# Patient Record
Sex: Male | Born: 1975 | Race: White | Hispanic: No | Marital: Single | Smoking: Never smoker
Health system: Southern US, Community
[De-identification: ages and names within clinical notes are randomized; demographics above are authoritative.]

## PROBLEM LIST (undated history)

## (undated) DIAGNOSIS — E119 Type 2 diabetes mellitus without complications: Secondary | ICD-10-CM

## (undated) DIAGNOSIS — Z87442 Personal history of urinary calculi: Secondary | ICD-10-CM

## (undated) DIAGNOSIS — G629 Polyneuropathy, unspecified: Secondary | ICD-10-CM

## (undated) DIAGNOSIS — R0600 Dyspnea, unspecified: Secondary | ICD-10-CM

## (undated) DIAGNOSIS — N200 Calculus of kidney: Secondary | ICD-10-CM

## (undated) HISTORY — PX: MULTIPLE TOOTH EXTRACTIONS: SHX2053

---

## 2008-03-24 ENCOUNTER — Emergency Department (HOSPITAL_COMMUNITY): Admission: EM | Admit: 2008-03-24 | Discharge: 2008-03-24 | Payer: Self-pay | Admitting: Emergency Medicine

## 2009-06-23 ENCOUNTER — Emergency Department (HOSPITAL_COMMUNITY): Admission: EM | Admit: 2009-06-23 | Discharge: 2009-06-23 | Payer: Self-pay | Admitting: Emergency Medicine

## 2009-06-29 ENCOUNTER — Ambulatory Visit (HOSPITAL_COMMUNITY): Admission: RE | Admit: 2009-06-29 | Discharge: 2009-06-29 | Payer: Self-pay | Admitting: Urology

## 2009-08-09 ENCOUNTER — Ambulatory Visit (HOSPITAL_COMMUNITY): Admission: RE | Admit: 2009-08-09 | Discharge: 2009-08-09 | Payer: Self-pay | Admitting: Urology

## 2011-04-03 LAB — COMPREHENSIVE METABOLIC PANEL
ALT: 43 U/L (ref 0–53)
AST: 47 U/L — ABNORMAL HIGH (ref 0–37)
Albumin: 3.7 g/dL (ref 3.5–5.2)
Alkaline Phosphatase: 64 U/L (ref 39–117)
GFR calc Af Amer: >60 mL/min (ref >60)
Potassium: 4.1 mEq/L (ref 3.5–5.1)
Sodium: 137 mEq/L (ref 135–145)
Total Protein: 7.9 g/dL (ref 6.0–8.3)

## 2011-04-03 LAB — URINE MICROSCOPIC-ADD ON

## 2011-04-03 LAB — URINALYSIS, ROUTINE W REFLEX MICROSCOPIC
Glucose, UA: NEGATIVE mg/dL
Ketones, ur: NEGATIVE mg/dL
Protein, ur: NEGATIVE mg/dL
pH: 7 (ref 5.0–8.0)

## 2011-04-03 LAB — DIFFERENTIAL
Basophils Relative: 0 % (ref 0–1)
Eosinophils Absolute: 0 10*3/uL (ref 0.0–0.7)
Lymphs Abs: 0.9 10*3/uL (ref 0.7–4.0)
Monocytes Absolute: 0.4 10*3/uL (ref 0.1–1.0)
Monocytes Relative: 3 % (ref 3–12)

## 2011-04-03 LAB — CBC
Hemoglobin: 15.1 g/dL (ref 13.0–17.0)
Platelets: 239 10*3/uL (ref 150–400)
RDW: 13.2 % (ref 11.5–15.5)

## 2012-02-16 ENCOUNTER — Encounter (HOSPITAL_COMMUNITY): Payer: Self-pay | Admitting: *Deleted

## 2012-02-16 ENCOUNTER — Emergency Department (HOSPITAL_COMMUNITY)
Admission: EM | Admit: 2012-02-16 | Discharge: 2012-02-17 | Disposition: A | Discharge status: Home / Self Care | Payer: Self-pay | Attending: Emergency Medicine | Admitting: Emergency Medicine

## 2012-02-16 DIAGNOSIS — N509 Disorder of male genital organs, unspecified: Secondary | ICD-10-CM | POA: Insufficient documentation

## 2012-02-16 DIAGNOSIS — F17200 Nicotine dependence, unspecified, uncomplicated: Secondary | ICD-10-CM | POA: Insufficient documentation

## 2012-02-16 DIAGNOSIS — N498 Inflammatory disorders of other specified male genital organs: Secondary | ICD-10-CM | POA: Insufficient documentation

## 2012-02-16 DIAGNOSIS — L0291 Cutaneous abscess, unspecified: Secondary | ICD-10-CM

## 2012-02-16 MED ORDER — LIDOCAINE HCL (PF) 1 % IJ SOLN
INTRAMUSCULAR | Status: AC
  Administered 2012-02-17: 01:00:00
  Filled 2012-02-16: qty 5

## 2012-02-16 NOTE — ED Notes (Signed)
John Meeker, PA in with pt to i&d abcess

## 2012-02-16 NOTE — ED Provider Notes (Addendum)
History     CSN: [id number]  Arrival date & time 02/16/12  2239   First MD Initiated Contact with Patient 02/16/12 2310      Chief Complaint  Patient presents with  . Abscess    (Consider location/radiation/quality/duration/timing/severity/associated sxs/prior treatment) HPI John Church is a 36 y.o. male who presents to the Emergency Department complaining of an abscess under his scrotum that he has had since last Friday. He states it started out as a small red, tender, papule and ha gotten larger and more inflamed over the course of a week. He has been using warm compresses with no relief. Today it opened and he had a large amount of blood. He has taken no medicines.  No PCP  History reviewed. No pertinent past medical history.  History reviewed. No pertinent past surgical history.  No family history on file.  History  Substance Use Topics  . Smoking status: Current Everyday Smoker  . Smokeless tobacco: Not on file  . Alcohol Use:       Review of Systems 10 Systems reviewed and are negative for acute change except as noted in the HPI. Allergies  Penicillins  Home Medications  No current outpatient prescriptions on file.  BP 155/94  Pulse 105  Temp(Src) 98.7 F (37.1 C) (Oral)  Resp 18  Ht 6\' 2"  (1.88 m)  Wt 310 lb (140.615 kg)  BMI 39.80 kg/m2  SpO2 99%  Physical Exam Physical examination:  Nursing notes reviewed; Vital signs and O2 SAT reviewed;  Constitutional: Well developed, Well nourished, Well hydrated, In no acute distress; Head:  Normocephalic, atraumatic; Eyes: EOMI, PERRL, No scleral icterus; ENMT: Mouth and pharynx normal, Mucous membranes moist; Neck: Supple, Full range of motion, No lymphadenopathy; Cardiovascular: Regular rate and rhythm, No murmur, rub, or gallop; Respiratory: Breath sounds clear & equal bilaterally, No rales, rhonchi, wheezes, or rub, Normal respiratory effort/excursion; Chest: Nontender, Movement normal; Abdomen: Soft,  Nontender, Nondistended, Normal bowel sounds; Genitourinary: No CVA tenderness;Genital and  performed with pt permission and male ED Tech chaparone present during exam.  Pt examined laying.  No perineal erythema.  No penile lesions or drainage.  Right  scrotal erythema, edema  And tenderness tenderness to palp to a 2 cm area that has a central small area of drainage. Drainage is blood mixed with purulent material..  Normal testicular lie.  No testicular tenderness to palp.   No inguinal LAN or palpable masses.   Extremities: Pulses normal, No tenderness, No edema, No calf edema or asymmetry.; Neuro: AA&Ox3, Major CN grossly intact.  No gross focal motor or sensory deficits in extremities.; Skin: Color normal, Warm, Dry  ED Course  INCISION AND DRAINAGE Date/Time: 02/16/2012 11:25 PM Performed by: Worthy Rancher Authorized by: Worthy Rancher Consent: Verbal consent obtained. Written consent not obtained. Risks and benefits: risks, benefits and alternatives were discussed Consent given by: patient Patient understanding: patient states understanding of the procedure being performed Imaging studies: imaging studies not available Required items: required blood products, implants, devices, and special equipment available Patient identity confirmed: verbally with patient Time out: Immediately prior to procedure a "time out" was called to verify the correct patient, procedure, equipment, support staff and site/side marked as required. Type: abscess Anesthesia: local infiltration Local anesthetic: lidocaine 2% without epinephrine Anesthetic total: 4 ml Patient sedated: no Scalpel size: 11 Needle gauge: 25 ga. Incision type: single straight Complexity: simple Drainage: purulent Drainage amount: copious Wound treatment: drain placed Packing material: 1/4 in iodoform gauze (~  18 inches ) Patient tolerance: Patient tolerated the procedure well with no immediate complications.   (including  critical care time)     MDM  Patient with scrotal wall abscess. I&D performed by midlevel with my supervision. Pt stable in ED with no significant deterioration in condition.The patient appears reasonably screened and/or stabilized for discharge and I doubt any other medical condition or other  [facility] requiring further screening, evaluation, or treatment in the ED at this time prior to discharge.   MDM Reviewed: nursing note and vitals    Medical screening examination/treatment/procedure(s) were conducted as a shared visit with non-physician practitioner(s) and myself.  I personally evaluated the patient during the encounter    Worthy Rancher, [place] 02/17/12 0001  Nicoletta Dress. Colon Branch, MD 02/17/12 0004  Nicoletta Dress. Colon Branch, MD 02/17/12 0005

## 2012-02-16 NOTE — ED Notes (Signed)
Abscess in groin area 

## 2012-02-17 MED ORDER — SULFAMETHOXAZOLE-TRIMETHOPRIM 800-160 MG PO TABS: 1.0000 | ORAL_TABLET | Freq: Two times a day (BID) | ORAL | Status: AC

## 2012-02-17 NOTE — Discharge Instructions (Signed)
Abscess Care After An abscess (also called a boil or furuncle) is an infected area that contains a collection of pus. Signs and symptoms of an abscess include pain, tenderness, redness, or hardness, or you may feel a moveable soft area under your skin. An abscess can occur anywhere in the body. The infection may spread to surrounding tissues causing cellulitis. A cut (incision) by the surgeon was made over your abscess and the pus was drained out. Gauze may have been packed into the space to provide a drain that will allow the cavity to heal from the inside outwards. The boil may be painful for 5 to 7 days. Most people with a boil do not have high fevers. Your abscess, if seen early, may not have localized, and may not have been lanced. If not, another appointment may be required for this if it does not get better on its own or with medications. HOME CARE INSTRUCTIONS   Only take over-the-counter or prescription medicines for pain, discomfort, or fever as directed by your caregiver.   When you bathe, soak and then remove gauze or iodoform packs at least daily or as directed by your caregiver. You may then wash the wound gently with mild soapy water. Repack with gauze or do as your caregiver directs.  SEEK IMMEDIATE MEDICAL CARE IF:   You develop increased pain, swelling, redness, drainage, or bleeding in the wound site.   You develop signs of generalized infection including muscle aches, chills, fever, or a general ill feeling.   An oral temperature above 102 F (38.9 C) develops, not controlled by medication.  See your caregiver for a recheck if you develop any of the symptoms described above. If medications (antibiotics) were prescribed, take them as directed. Document Released: 06/29/2005 Document Revised: 08/23/2011 Document Reviewed: 02/24/2008 Exit[employer] Patient Information 2012 [employer]. 

## 2012-02-19 LAB — WOUND CULTURE

## 2017-02-09 DIAGNOSIS — E119 Type 2 diabetes mellitus without complications: Secondary | ICD-10-CM | POA: Diagnosis not present

## 2017-03-30 DIAGNOSIS — E119 Type 2 diabetes mellitus without complications: Secondary | ICD-10-CM | POA: Diagnosis not present

## 2017-04-05 DIAGNOSIS — M79672 Pain in left foot: Secondary | ICD-10-CM | POA: Diagnosis not present

## 2017-04-05 DIAGNOSIS — M79671 Pain in right foot: Secondary | ICD-10-CM | POA: Diagnosis not present

## 2017-04-05 DIAGNOSIS — E119 Type 2 diabetes mellitus without complications: Secondary | ICD-10-CM | POA: Diagnosis not present

## 2018-01-01 DIAGNOSIS — R739 Hyperglycemia, unspecified: Secondary | ICD-10-CM | POA: Diagnosis not present

## 2018-01-01 DIAGNOSIS — E119 Type 2 diabetes mellitus without complications: Secondary | ICD-10-CM | POA: Diagnosis not present

## 2018-01-16 DIAGNOSIS — M79672 Pain in left foot: Secondary | ICD-10-CM | POA: Diagnosis not present

## 2018-01-16 DIAGNOSIS — M79671 Pain in right foot: Secondary | ICD-10-CM | POA: Diagnosis not present

## 2018-01-16 DIAGNOSIS — E119 Type 2 diabetes mellitus without complications: Secondary | ICD-10-CM | POA: Diagnosis not present

## 2018-05-09 DIAGNOSIS — E119 Type 2 diabetes mellitus without complications: Secondary | ICD-10-CM | POA: Diagnosis not present

## 2018-05-15 DIAGNOSIS — M79671 Pain in right foot: Secondary | ICD-10-CM | POA: Diagnosis not present

## 2018-05-15 DIAGNOSIS — M79672 Pain in left foot: Secondary | ICD-10-CM | POA: Diagnosis not present

## 2018-05-15 DIAGNOSIS — E119 Type 2 diabetes mellitus without complications: Secondary | ICD-10-CM | POA: Diagnosis not present

## 2018-08-07 DIAGNOSIS — E119 Type 2 diabetes mellitus without complications: Secondary | ICD-10-CM | POA: Diagnosis not present

## 2018-09-26 DIAGNOSIS — M79672 Pain in left foot: Secondary | ICD-10-CM | POA: Diagnosis not present

## 2018-09-26 DIAGNOSIS — M79671 Pain in right foot: Secondary | ICD-10-CM | POA: Diagnosis not present

## 2018-09-26 DIAGNOSIS — E119 Type 2 diabetes mellitus without complications: Secondary | ICD-10-CM | POA: Diagnosis not present

## 2018-12-25 DIAGNOSIS — U071 COVID-19: Secondary | ICD-10-CM

## 2018-12-25 HISTORY — DX: COVID-19: U07.1

## 2018-12-30 DIAGNOSIS — E119 Type 2 diabetes mellitus without complications: Secondary | ICD-10-CM | POA: Diagnosis not present

## 2019-01-02 DIAGNOSIS — M79672 Pain in left foot: Secondary | ICD-10-CM | POA: Diagnosis not present

## 2019-01-02 DIAGNOSIS — M79671 Pain in right foot: Secondary | ICD-10-CM | POA: Diagnosis not present

## 2019-01-02 DIAGNOSIS — E119 Type 2 diabetes mellitus without complications: Secondary | ICD-10-CM | POA: Diagnosis not present

## 2019-04-07 DIAGNOSIS — R739 Hyperglycemia, unspecified: Secondary | ICD-10-CM | POA: Diagnosis not present

## 2019-04-07 DIAGNOSIS — E119 Type 2 diabetes mellitus without complications: Secondary | ICD-10-CM | POA: Diagnosis not present

## 2019-04-10 DIAGNOSIS — E119 Type 2 diabetes mellitus without complications: Secondary | ICD-10-CM | POA: Diagnosis not present

## 2019-04-10 DIAGNOSIS — M79672 Pain in left foot: Secondary | ICD-10-CM | POA: Diagnosis not present

## 2019-04-10 DIAGNOSIS — M79671 Pain in right foot: Secondary | ICD-10-CM | POA: Diagnosis not present

## 2019-12-01 ENCOUNTER — Encounter (HOSPITAL_COMMUNITY): Payer: Self-pay

## 2019-12-01 ENCOUNTER — Emergency Department (HOSPITAL_COMMUNITY)
Admission: EM | Admit: 2019-12-01 | Discharge: 2019-12-01 | Disposition: A | Discharge status: Home / Self Care | Payer: 59 | Attending: Emergency Medicine | Admitting: Emergency Medicine

## 2019-12-01 ENCOUNTER — Other Ambulatory Visit: Payer: Self-pay

## 2019-12-01 DIAGNOSIS — Z87891 Personal history of nicotine dependence: Secondary | ICD-10-CM | POA: Diagnosis not present

## 2019-12-01 DIAGNOSIS — Z79899 Other long term (current) drug therapy: Secondary | ICD-10-CM | POA: Insufficient documentation

## 2019-12-01 DIAGNOSIS — R112 Nausea with vomiting, unspecified: Secondary | ICD-10-CM | POA: Insufficient documentation

## 2019-12-01 DIAGNOSIS — E119 Type 2 diabetes mellitus without complications: Secondary | ICD-10-CM | POA: Diagnosis not present

## 2019-12-01 DIAGNOSIS — Z7984 Long term (current) use of oral hypoglycemic drugs: Secondary | ICD-10-CM | POA: Diagnosis not present

## 2019-12-01 HISTORY — DX: Type 2 diabetes mellitus without complications: E11.9

## 2019-12-01 HISTORY — DX: Calculus of kidney: N20.0

## 2019-12-01 LAB — BLOOD GAS, VENOUS
Acid-Base Excess: 0.4 mmol/L (ref 0.0–2.0)
Bicarbonate: 23.8 mmol/L (ref 20.0–28.0)
Drawn by: 4437
FIO2: 21
O2 Saturation: 59 %
Patient temperature: 37.3
pCO2, Ven: 40.2 mmHg — ABNORMAL LOW (ref 44.0–60.0)
pH, Ven: 7.402 (ref 7.250–7.430)
pO2, Ven: 31.8 mmHg — CL (ref 32.0–45.0)

## 2019-12-01 LAB — COMPREHENSIVE METABOLIC PANEL
ALT: 49 U/L — ABNORMAL HIGH (ref 0–44)
AST: 38 U/L (ref 15–41)
Albumin: 3.8 g/dL (ref 3.5–5.0)
Alkaline Phosphatase: 66 U/L (ref 38–126)
Anion gap: 13 (ref 5–15)
BUN: 10 mg/dL (ref 6–20)
CO2: 21 mmol/L — ABNORMAL LOW (ref 22–32)
Calcium: 8.2 mg/dL — ABNORMAL LOW (ref 8.9–10.3)
Chloride: 96 mmol/L — ABNORMAL LOW (ref 98–111)
Creatinine, Ser: 0.87 mg/dL (ref 0.61–1.24)
GFR calc Af Amer: >60 mL/min (ref >60)
GFR calc non Af Amer: >60 mL/min (ref >60)
Glucose, Bld: 358 mg/dL — ABNORMAL HIGH (ref 70–99)
Potassium: 3.3 mmol/L — ABNORMAL LOW (ref 3.5–5.1)
Sodium: 130 mmol/L — ABNORMAL LOW (ref 135–145)
Total Bilirubin: 1.6 mg/dL — ABNORMAL HIGH (ref 0.3–1.2)
Total Protein: 8.4 g/dL — ABNORMAL HIGH (ref 6.5–8.1)

## 2019-12-01 LAB — URINALYSIS, ROUTINE W REFLEX MICROSCOPIC
Bilirubin Urine: NEGATIVE
Glucose, UA: >=500 mg/dL — AB
Ketones, ur: 80 mg/dL — AB
Leukocytes,Ua: NEGATIVE
Nitrite: NEGATIVE
Protein, ur: 30 mg/dL — AB
Specific Gravity, Urine: 1.032 — ABNORMAL HIGH (ref 1.005–1.030)
pH: 6 (ref 5.0–8.0)

## 2019-12-01 LAB — CBC
HCT: 50.2 % (ref 39.0–52.0)
Hemoglobin: 17.2 g/dL — ABNORMAL HIGH (ref 13.0–17.0)
MCH: 28.7 pg (ref 26.0–34.0)
MCHC: 34.3 g/dL (ref 30.0–36.0)
MCV: 83.7 fL (ref 80.0–100.0)
Platelets: 130 10*3/uL — ABNORMAL LOW (ref 150–400)
RBC: 6 MIL/uL — ABNORMAL HIGH (ref 4.22–5.81)
RDW: 11.9 % (ref 11.5–15.5)
WBC: 4.6 10*3/uL (ref 4.0–10.5)
nRBC: 0 % (ref 0.0–0.2)

## 2019-12-01 LAB — BETA-HYDROXYBUTYRIC ACID: Beta-Hydroxybutyric Acid: 1.85 mmol/L — ABNORMAL HIGH (ref 0.05–0.27)

## 2019-12-01 LAB — LIPASE, BLOOD: Lipase: 18 U/L (ref 11–51)

## 2019-12-01 MED ORDER — ONDANSETRON 4 MG PO TBDP: 4.0000 mg | ORAL_TABLET | Freq: Three times a day (TID) | ORAL | 0 refills | Status: DC | PRN

## 2019-12-01 MED ORDER — ONDANSETRON 4 MG PO TBDP
4.0000 mg | ORAL_TABLET | Freq: Once | ORAL | Status: AC | PRN
  Administered 2019-12-01: 4 mg via ORAL
  Filled 2019-12-01: qty 1

## 2019-12-01 MED ORDER — SODIUM CHLORIDE 0.9 % IV BOLUS
1000.0000 mL | Freq: Once | INTRAVENOUS | Status: AC
  Administered 2019-12-01: 1000 mL via INTRAVENOUS

## 2019-12-01 MED ORDER — POTASSIUM CHLORIDE CRYS ER 20 MEQ PO TBCR
40.0000 meq | EXTENDED_RELEASE_TABLET | Freq: Once | ORAL | Status: AC
Start: 2019-12-01 — End: 2019-12-01
  Administered 2019-12-01: 40 meq via ORAL
  Filled 2019-12-01: qty 2

## 2019-12-01 NOTE — ED Notes (Addendum)
Per pt, emesis x 9 times today.  Pt tested positive for covid on Friday.

## 2019-12-01 NOTE — Discharge Instructions (Addendum)
Please take Zofran ODT for her nausea symptoms, as prescribed.  Please also continue to hydrate to replace lost fluids.  Please continue to take Metformin at your scheduled times and to continue regularly checking your blood sugars.  Please return to the ED or seek medical attention should you develop any new or worsening symptoms.

## 2019-12-01 NOTE — ED Triage Notes (Signed)
Pt diagnoses with Covid on Friday. Pt states he started vomiting this am and unable to keep anything down.

## 2019-12-01 NOTE — ED Notes (Signed)
Date and time results received: 12/01/19 9:40 PM  (use smartphrase ".now" to insert current time)  Test: PO2 31.8   Name of Provider Notified: Nyoka Cowden & Zammit  Orders Received? Or Actions Taken?:  None at this time.

## 2019-12-01 NOTE — ED Provider Notes (Signed)
[facility] EMERGENCY DEPARTMENT Provider Note   CSN: [id number] Arrival date & time: 12/01/19  1620     History   Chief Complaint Chief Complaint  Patient presents with  . Emesis  . Covid Positive    HPI John Church is a 43 y.o. male with PMH significant for type II DM and positive COVID-19 testing obtained 11/28/2019 presents to the ED with 1 day history of uncontrolled nausea and vomiting.  Patient reports he was for symptomatic of COVID-19 on 11/24/2019 and was tested 5 days later after he lost his sense of taste and smell.  He reportedly was doing fine, but this morning he woke up with profound nausea and vomiting.  He suspects that it may have been related to postnasal drip from his runny nose, but his inability to tolerate food or liquid by mouth persisted all day long.  He denies any hematemesis.  He also denies any headache or dizziness, fevers or chills, chest pain or shortness of breath, difficult breathing, or significant abdominal tenderness.  Patient reports loose stools, but he is on Metformin for his diabetes and it is no more than usual.    HPI  Past Medical History:  Diagnosis Date  . Diabetes mellitus without complication ([place])   . Kidney stone     There are no active problems to display for this patient.   History reviewed. No pertinent surgical history.      Home Medications    Prior to Admission medications   Medication Sig Start Date End Date Taking? Authorizing Provider  metFORMIN (GLUCOPHAGE) 500 MG tablet Take 500 mg by mouth 2 (two) times daily. 11/17/19  Yes [provider]  ONGLYZA 5 MG TABS tablet Take 5 mg by mouth daily. 11/17/19  Yes [provider]  ondansetron (ZOFRAN ODT) 4 MG disintegrating tablet Take 1 tablet (4 mg total) by mouth every 8 (eight) hours as needed for nausea or vomiting. 12/01/19   Corena Herter, PA-C    Family History No family history on file.  Social History Social History   Tobacco Use   . Smoking status: Former Research scientist (life sciences)  . Smokeless tobacco: Never Used  Substance Use Topics  . Alcohol use: Not Currently  . Drug use: Not Currently     Allergies   Penicillins   Review of Systems Review of Systems  All other systems reviewed and are negative.    Physical Exam Updated Vital Signs BP 126/73   Pulse 98   Temp 99.2 F (37.3 C) (Oral)   Resp 20   Ht 6\' 2"  (1.88 m)   Wt 117.9 kg   SpO2 92%   BMI 33.38 kg/m   Physical Exam Vitals signs and nursing note reviewed. Exam conducted with a chaperone present.  Constitutional:      Appearance: Normal appearance. He is not diaphoretic.  HENT:     Head: Normocephalic and atraumatic.  Eyes:     General: No scleral icterus.    Conjunctiva/sclera: Conjunctivae normal.  Cardiovascular:     Rate and Rhythm: Normal rate and regular rhythm.     Pulses: Normal pulses.     Heart sounds: Normal heart sounds.  Pulmonary:     Effort: Pulmonary effort is normal. No respiratory distress.     Breath sounds: Normal breath sounds.  Abdominal:     Comments: Soft, nondistended.  No significant TTP.  No overlying skin changes.  No guarding.  Skin:    General: Skin is dry.  Neurological:  Mental Status: He is alert and oriented to person, place, and time.     GCS: GCS eye subscore is 4. GCS verbal subscore is 5. GCS motor subscore is 6.  Psychiatric:        Mood and Affect: Mood normal.        Behavior: Behavior normal.        Thought Content: Thought content normal.      ED Treatments / Results  Labs (all labs ordered are listed, but only abnormal results are displayed) Labs Reviewed  COMPREHENSIVE METABOLIC PANEL - Abnormal; Notable for the following components:      Result Value   Sodium 130 (*)    Potassium 3.3 (*)    Chloride 96 (*)    CO2 21 (*)    Glucose, Bld 358 (*)    Calcium 8.2 (*)    Total Protein 8.4 (*)    ALT 49 (*)    Total Bilirubin 1.6 (*)    All other components within normal limits  CBC -  Abnormal; Notable for the following components:   RBC 6.00 (*)    Hemoglobin 17.2 (*)    Platelets 130 (*)    All other components within normal limits  BLOOD GAS, VENOUS - Abnormal; Notable for the following components:   pCO2, Ven 40.2 (*)    pO2, Ven 31.8 (*)    All other components within normal limits  BETA-HYDROXYBUTYRIC ACID - Abnormal; Notable for the following components:   Beta-Hydroxybutyric Acid 1.85 (*)    All other components within normal limits  LIPASE, BLOOD  URINALYSIS, ROUTINE W REFLEX MICROSCOPIC    EKG None  Radiology No results found.  Procedures Procedures (including critical care time)  Medications Ordered in ED Medications  ondansetron (ZOFRAN-ODT) disintegrating tablet 4 mg (4 mg Oral Given 12/01/19 1720)  sodium chloride 0.9 % bolus 1,000 mL (0 mLs Intravenous Stopped 12/01/19 2245)  potassium chloride SA (KLOR-CON) CR tablet 40 mEq (40 mEq Oral Given 12/01/19 2244)     Initial Impression / Assessment and Plan / ED Course  I have reviewed the triage vital signs and the nursing notes.  Pertinent labs & imaging results that were available during my care of the patient were reviewed by me and considered in my medical decision making (see chart for details).        Patient's venous pH is 7.40 and he is not acidotic, despite elevated beta hydroxybutyric acid.  Repleted low potassium with 40 mEq K-Dur.  Repleted his hyponatremia with 1 L normal saline.  Patient is feeling significantly improved after receiving Zofran ODT in triage.  No episodes of nausea or vomiting while in the ED.  CBC demonstrates no significant abnormalities.  UA demonstrates glucosuria and ketonuria, as expected, as well as a specific gravity that is elevated 1.032.  Will encourage increased oral hydration.  On reevaluation, patient is significantly improved rehydrated.  He feels ready for discharge.  Encouraged him to track his blood sugars regularly and to take Metformin, as  prescribed.  Discussed case with Dr. Estell Harpin.  We will prescribe him Zofran ODT to take as needed for nausea and vomiting.  Encouraged increased fluid hydration given his dehydration with elevated heart rate.   All of the evaluation and work-up results were discussed with the patient and any family at bedside. They were provided opportunity to ask any additional questions and have none at this time. They have expressed understanding of verbal discharge instructions as well as return precautions  and are agreeable to the plan.   [facility] 12[facility]2019 for the symptoms described in the history of present illness. He was evaluated in the context of the global COVID-19 pandemic, which necessitated consideration that the patient might be at risk for infection with the SARS-CoV-2 virus that causes COVID-19. Institutional protocols and algorithms that pertain to the evaluation of patients at risk for COVID-19 are in a state of rapid change based on information released by regulatory bodies including the CDC and federal and state organizations. These policies and algorithms were followed during the patient's care in the ED.   Final Clinical Impressions(s) / ED Diagnoses   Final diagnoses:  Non-intractable vomiting with nausea, unspecified vomiting type    ED Discharge Orders         Ordered    ondansetron (ZOFRAN ODT) 4 MG disintegrating tablet  Every 8 hours PRN     12/01/19 2256           Lorelee NewGreen, [place] Zapien L, PA-C 12/01/19 2309    Bethann BerkshireZammit, Joseph, MD 12/05/19 [phone]

## 2021-04-24 ENCOUNTER — Ambulatory Visit
Admission: EM | Admit: 2021-04-24 | Discharge: 2021-04-24 | Disposition: A | Discharge status: Home / Self Care | Payer: 59

## 2021-04-24 ENCOUNTER — Other Ambulatory Visit: Payer: Self-pay

## 2021-04-24 ENCOUNTER — Encounter: Payer: Self-pay | Admitting: *Deleted

## 2021-04-24 DIAGNOSIS — L089 Local infection of the skin and subcutaneous tissue, unspecified: Secondary | ICD-10-CM

## 2021-04-24 DIAGNOSIS — E11628 Type 2 diabetes mellitus with other skin complications: Secondary | ICD-10-CM | POA: Diagnosis not present

## 2021-04-24 DIAGNOSIS — E08621 Diabetes mellitus due to underlying condition with foot ulcer: Secondary | ICD-10-CM

## 2021-04-24 HISTORY — DX: Polyneuropathy, unspecified: G62.9

## 2021-04-24 LAB — POCT FASTING CBG KUC MANUAL ENTRY: POCT Glucose (KUC): 107 mg/dL — AB (ref 70–99)

## 2021-04-24 MED ORDER — DOXYCYCLINE HYCLATE 100 MG PO CAPS: 100.0000 mg | ORAL_CAPSULE | Freq: Two times a day (BID) | ORAL | 0 refills | Status: DC

## 2021-04-24 NOTE — ED Provider Notes (Addendum)
[facility]    CSN: [id number] Arrival date & time: 04/24/21  1202      History   Chief Complaint Chief Complaint  Patient presents with  . Toe Pain    HPI John Church is a 45 y.o. male.   HPI  Patient presents today for evaluation of a left toe infection.  Patient is a type II diabetic and reports that his last A1c was approximately 9.  He only recently began to take [place] and O[place]lyza.  He has severe peripheral neuropathy, and is uncertain how long the great toe infection has been present.  The toe became painful last night while at work and began to drain pus drainage. He doesn't check his blood sugar at home.  Past Medical History:  Diagnosis Date  . Diabetes mellitus without complication (HCC)   . Kidney stone   . Kidney stone   . Peripheral neuropathy     There are no problems to display for this patient.   History reviewed. No pertinent surgical history.     Home Medications    Prior to Admission medications   Medication Sig Start Date End Date Taking? Authorizing Provider  empagliflozin (JARDIANCE) 25 MG TABS tablet Take by mouth daily. Started 04/21/2021   Yes [provider]  metFORMIN (GLUCOPHAGE) 500 MG tablet Take 500 mg by mouth 2 (two) times daily. 11/17/19  Yes [provider]  ONGLYZA 5 MG TABS tablet Take 5 mg by mouth daily. 11/17/19  Yes [provider]  ondansetron (ZOFRAN ODT) 4 MG disintegrating tablet Take 1 tablet (4 mg total) by mouth every 8 (eight) hours as needed for nausea or vomiting. 12/01/19   Lorelee New, PA-C    Family History Family History  Problem Relation Age of Onset  . Diabetes Mother   . Diabetes Father   . Diabetes Brother     Social History Social History   Tobacco Use  . Smoking status: Never Smoker  . Smokeless tobacco: Never Used  Vaping Use  . Vaping Use: Never used  Substance Use Topics  . Alcohol use: Not Currently  . Drug use: Never     Allergies    Penicillins   Review of Systems Review of Systems Pertinent negatives listed in HPI   Physical Exam Triage Vital Signs ED Triage Vitals  Enc Vitals Group     BP 04/24/21 1423 137/88     Pulse Rate 04/24/21 1423 (!) 107     Resp 04/24/21 1423 18     Temp 04/24/21 1423 98.3 F (36.8 C)     Temp Source 04/24/21 1423 Temporal     SpO2 04/24/21 1423 96 %     Weight --      Height --      Head Circumference --      Peak Flow --      Pain Score 04/24/21 1424 0     Pain Loc --      Pain Edu? --      Excl. in GC? --    No data found.  Updated Vital Signs BP 137/88   Pulse (!) 107   Temp 98.3 F (36.8 C) (Temporal)   Resp 18   SpO2 96%   Visual Acuity Right Eye Distance:   Left Eye Distance:   Bilateral Distance:    Right Eye Near:   Left Eye Near:    Bilateral Near:     Physical Exam General appearance: Alert, non-toxic appearing, no  acute distress Head: Normocephalic, without obvious abnormality, atraumatic Respiratory: Respirations even and unlabored, normal respiratory rate Heart: Tachycardia present, rhythm normal. No gallop or murmurs noted on exam  Extremities: Left foot and ankle edema present, +2 DP pulse, tenderness along base of left toes 1-4. See picture left great toe Skin: Skin color, texture, turgor normal. No rashes seen  Psych: Appropriate mood and affect. Neurologic: GCS 15, normal coordination, normal gait       UC Treatments / Results  Labs (all labs ordered are listed, but only abnormal results are displayed) Labs Reviewed - No data to display  EKG   Radiology No results found.  Procedures Procedures (including critical care time)  Medications Ordered in UC Medications - No data to display  Initial Impression / Assessment and Plan / UC Course  I have reviewed the triage vital signs and the nursing notes.  Pertinent labs & imaging results that were available during my care of the patient were reviewed by me and considered  in my medical decision making (see chart for details).     Diabetic left great toe infection, patient is uncontrolled diabetic.  Concern for possible ischemic changes related to appearance of the tendon.  Will start on antibiotics, Doxycyline  and placed an Urgent Care referral for patient to be evaluated by Dr. Lajoyce Corners at Orthopedics. ER precautions given if pain worsens.  Patient placed in a postop shoe and advised not to wear any closed toed shoe until evaluated by Dr. Lajoyce Corners.  Advised if Dr. Lajoyce Corners is unable to see him within 24 hours to go immediately to the ER. Final Clinical Impressions(s) / UC Diagnoses   Final diagnoses:  Diabetic foot infection (HCC)  Diabetic ulcer of toe of left foot associated with diabetes mellitus due to underlying condition, unspecified ulcer stage [facility])     Discharge Instructions     Complete entire course of medication.  Contact Dr. Lajoyce Corners office in the morning to schedule visit for evaluation of diabetic toe wound infection. If he is unable to see your within 24 hours or your pain worsens, go to emergency department.    ED Prescriptions    Medication Sig Dispense Auth. Provider   doxycycline (VIBRAMYCIN) 100 MG capsule Take 1 capsule (100 mg total) by mouth 2 (two) times daily. 20 capsule Bing Neighbors, FNP     PDMP not reviewed this encounter.   Bing Neighbors, FNP 04/25/21 0802    Bing Neighbors, FNP 04/25/21 [phone]

## 2021-04-24 NOTE — ED Triage Notes (Signed)
C/O redness to left great toe redness and slight discomfort over past couple weeks; wears steel-toed boots at work.  States last night took boot off at work after having pain extending up into left lower leg and significant purulent drainage in sock. Left great toe very red, tight with redness extending up into distal foot; toenail fell off.  Denies fevers.

## 2021-04-24 NOTE — Discharge Instructions (Addendum)
Complete entire course of medication.  Contact Dr. Lajoyce Corners office in the morning to schedule visit for evaluation of diabetic toe wound infection. If he is unable to see your within 24 hours or your pain worsens, go to emergency department.

## 2021-04-25 ENCOUNTER — Encounter: Payer: Self-pay | Admitting: Orthopedic Surgery

## 2021-04-25 ENCOUNTER — Ambulatory Visit: Payer: 59 | Admitting: Orthopedic Surgery

## 2021-04-25 ENCOUNTER — Ambulatory Visit: Payer: Self-pay

## 2021-04-25 DIAGNOSIS — M79672 Pain in left foot: Secondary | ICD-10-CM | POA: Diagnosis not present

## 2021-04-25 LAB — CBC WITH DIFFERENTIAL/PLATELET
Basophils Absolute: 0.1 10*3/uL (ref 0.0–0.2)
Basos: 1 %
EOS (ABSOLUTE): 0.1 10*3/uL (ref 0.0–0.4)
Eos: 1 %
Hematocrit: 47.3 % (ref 37.5–51.0)
Hemoglobin: 15.9 g/dL (ref 13.0–17.7)
Immature Grans (Abs): 0 10*3/uL (ref 0.0–0.1)
Immature Granulocytes: 0 %
Lymphocytes Absolute: 2.1 10*3/uL (ref 0.7–3.1)
Lymphs: 11 %
MCH: 29 pg (ref 26.6–33.0)
MCHC: 33.6 g/dL (ref 31.5–35.7)
MCV: 86 fL (ref 79–97)
Monocytes Absolute: 1.4 10*3/uL — ABNORMAL HIGH (ref 0.1–0.9)
Monocytes: 7 %
Neutrophils Absolute: 15.6 10*3/uL — ABNORMAL HIGH (ref 1.4–7.0)
Neutrophils: 80 %
Platelets: 358 10*3/uL (ref 150–450)
RBC: 5.48 x10E6/uL (ref 4.14–5.80)
RDW: 12.5 % (ref 11.6–15.4)
WBC: 19.3 10*3/uL — ABNORMAL HIGH (ref 3.4–10.8)

## 2021-04-25 NOTE — Addendum Note (Signed)
Addended by: Rodena Medin on: 04/25/2021 02:24 PM   Modules accepted: Orders

## 2021-04-25 NOTE — Progress Notes (Signed)
Office Visit Note   Patient: John Church           Date of Birth: [birth year]           MRN: [id number] Visit Date: 04/25/2021              Requested by: [facility], [facility] [address] [place],  [place] [address] PCP: [facility]  Chief Complaint  Patient presents with  . Left Foot - Follow-up    ER yesterday GT pain      HPI: Patient is a 45 year old gentleman who was seen for initial evaluation for a paronychial infection left great toe patient states that while wearing steel toe shoes at work the abscess ruptured and he had pus in his shoe.  Patient was seen in the emergency room yesterday was started on doxycycline.  Patient states he is a non-smoker his last hemoglobin A1c was 9 his white blood cell count was 19.3.  Patient states that his brother just had his toe amputated for infection as well.  Assessment & Plan: Visit Diagnoses:  1. Pain in left foot     Plan: Patient will stay out of work he is given a note to be out of work for 2 weeks continue the doxycycline start Dial soap cleansing tomorrow we obtained cultures today will adjust antibiotics if needed on Thursday.  Follow-Up Instructions: Return in about 1 week (around 05/02/2021) for Follow-up Thursday.   Ortho Exam  Patient is alert, oriented, no adenopathy, well-dressed, normal affect, normal respiratory effort. Examination patient has a good dorsalis pedis pulse there is no venous stasis ulcers or swelling in his calf there is a paronychial infection and previous avulsion of the great toenail.  There is some bruising on the end of his toe most likely from blunt trauma in his steel toe shoes.  After informed consent patient underwent a digital block with 10 cc of half percent Marcaine plain after adequate levels anesthesia were obtained a freer was used to open up the paronychial infection.  This fluid was cultured and sent for cultures there is no cellulitis proximal to the IP joint.  The drainage  is bloody and not purulent.  Imaging: XR Foot Complete Left  Result Date: 04/25/2021 Three-view radiographs of the left foot shows no soft tissue no destructive bony changes noted calcified vessels  No images are attached to the encounter.  Labs: Lab Results  Component Value Date   REPTSTATUS 02/19/2012 FINAL 02/17/2012   GRAMSTAIN  02/17/2012    FEW WBC PRESENT, PREDOMINANTLY PMN NO SQUAMOUS EPITHELIAL CELLS SEEN MODERATE GRAM POSITIVE COCCI IN PAIRS FEW GRAM NEGATIVE RODS   CULT  02/17/2012    ABUNDANT GROUP B STREP(S.AGALACTIAE)ISOLATED Note: TESTING AGAINST S. AGALACTIAE NOT ROUTINELY PERFORMED DUE TO PREDICTABILITY OF AMP/PEN/VAN SUSCEPTIBILITY.     Lab Results  Component Value Date   ALBUMIN 3.8 12/01/2019   ALBUMIN 3.7 06/23/2009    No results found for: MG No results found for: VD25OH  No results found for: PREALBUMIN CBC EXTENDED Latest Ref Rng & Units 04/24/2021 12/01/2019 06/23/2009  WBC 3.4 - 10.8 x10E3/uL 19.3(H) 4.6 13.4(H)  RBC 4.14 - 5.80 x10E6/uL 5.48 6.00(H) 4.97  HGB 13.0 - 17.7 g/dL 03.5 17.2(H) 15.1  HCT 37.5 - 51.0 % 47.3 50.2 42.8  PLT 150 - 450 x10E3/uL 358 130(L) 239  NEUTROABS 1.4 - 7.0 x10E3/uL 15.6(H) - 12.1(H)  LYMPHSABS 0.7 - 3.1 x10E3/uL 2.1 - 0.9     There is no  height or weight on file to calculate BMI.  Orders:  Orders Placed This Encounter  Procedures  . XR Foot Complete Left   No orders of the defined types were placed in this encounter.    Procedures: No procedures performed  Clinical Data: No additional findings.  ROS:  All other systems negative, except as noted in the HPI. Review of Systems  Objective: Vital Signs: There were no vitals taken for this visit.  Specialty Comments:  No specialty comments available.  PMFS History: There are no problems to display for this patient.  Past Medical History:  Diagnosis Date  . Diabetes mellitus without complication (HCC)   . Kidney stone   . Kidney stone   .  Peripheral neuropathy     Family History  Problem Relation Age of Onset  . Diabetes Mother   . Diabetes Father   . Diabetes Brother     History reviewed. No pertinent surgical history. Social History   Occupational History  . Not on file  Tobacco Use  . Smoking status: Never Smoker  . Smokeless tobacco: Never Used  Vaping Use  . Vaping Use: Never used  Substance and Sexual Activity  . Alcohol use: Not Currently  . Drug use: Never  . Sexual activity: Not on file

## 2021-04-28 ENCOUNTER — Encounter: Payer: Self-pay | Admitting: Orthopedic Surgery

## 2021-04-28 ENCOUNTER — Encounter (HOSPITAL_COMMUNITY): Payer: Self-pay | Admitting: Orthopedic Surgery

## 2021-04-28 ENCOUNTER — Other Ambulatory Visit: Payer: Self-pay

## 2021-04-28 ENCOUNTER — Ambulatory Visit: Payer: 59 | Admitting: Orthopedic Surgery

## 2021-04-28 ENCOUNTER — Other Ambulatory Visit: Payer: Self-pay | Admitting: Physician Assistant

## 2021-04-28 ENCOUNTER — Other Ambulatory Visit (HOSPITAL_COMMUNITY)
Admission: RE | Admit: 2021-04-28 | Discharge: 2021-04-28 | Disposition: A | Discharge status: Home / Self Care | Payer: 59 | Source: Ambulatory Visit | Attending: Orthopedic Surgery | Admitting: Orthopedic Surgery

## 2021-04-28 DIAGNOSIS — Z01812 Encounter for preprocedural laboratory examination: Secondary | ICD-10-CM | POA: Diagnosis present

## 2021-04-28 DIAGNOSIS — L02612 Cutaneous abscess of left foot: Secondary | ICD-10-CM | POA: Diagnosis not present

## 2021-04-28 DIAGNOSIS — Z20822 Contact with and (suspected) exposure to covid-19: Secondary | ICD-10-CM | POA: Diagnosis not present

## 2021-04-28 DIAGNOSIS — M869 Osteomyelitis, unspecified: Secondary | ICD-10-CM | POA: Diagnosis not present

## 2021-04-28 LAB — WOUND CULTURE
MICRO NUMBER:: 11837794
SPECIMEN QUALITY:: ADEQUATE

## 2021-04-28 NOTE — Progress Notes (Signed)
Office Visit Note   Patient: John Church           Date of Birth: 07-Jun-1976           MRN: [id number] Visit Date: 04/28/2021              Requested by: [facility] [address] [place],  [place] [address] PCP: [facility]  Chief Complaint  Patient presents with  . Left Foot - Follow-up      HPI: Patient is a 45 year old gentleman who presents status post debridement of an abscess paronychial left great toe he was started on doxycycline cultures were obtained and showing gram-positive cocci in chains.  Patient complains of increased redness pain and swelling.  Assessment & Plan: Visit Diagnoses:  1. Osteomyelitis of great toe of left foot (HCC)   2. Abscess of left great toe     Plan: Due to the progression of the infection with exposed bone.  No abscess we will plan for a amputation of the great toe through the MTP joint.  Plan for outpatient surgery minimize weightbearing postoperatively risk and benefits were discussed including risk of the wound not healing need for additional surgery.  Patient states he understands wished to proceed at this time.  Follow-Up Instructions: Return in about 1 week (around 05/05/2021).   Ortho Exam  Patient is alert, oriented, no adenopathy, well-dressed, normal affect, normal respiratory effort. Examination patient has a good dorsalis pedis and posterior tibial pulse.  He now has a purulent draining ulcer from the tip of the great toe this probes down to the tuft of the great toe with a large dead space.  The cellulitis beneath the area of the paronychial infection has not resolved and there is purulent drainage the cellulitis extends to the MTP joint with sausage digit swelling.  Imaging: No results found. No images are attached to the encounter.  Labs: Lab Results  Component Value Date   REPTSTATUS 02/19/2012 FINAL 02/17/2012   GRAMSTAIN  02/17/2012    FEW WBC PRESENT, PREDOMINANTLY PMN NO SQUAMOUS EPITHELIAL  CELLS SEEN MODERATE GRAM POSITIVE COCCI IN PAIRS FEW GRAM NEGATIVE RODS   CULT  02/17/2012    ABUNDANT GROUP B STREP(S.AGALACTIAE)ISOLATED Note: TESTING AGAINST S. AGALACTIAE NOT ROUTINELY PERFORMED DUE TO PREDICTABILITY OF AMP/PEN/VAN SUSCEPTIBILITY.     Lab Results  Component Value Date   ALBUMIN 3.8 12/01/2019   ALBUMIN 3.7 06/23/2009    No results found for: MG No results found for: VD25OH  No results found for: PREALBUMIN CBC EXTENDED Latest Ref Rng & Units 04/24/2021 12/01/2019 06/23/2009  WBC 3.4 - 10.8 x10E3/uL 19.3(H) 4.6 13.4(H)  RBC 4.14 - 5.80 x10E6/uL 5.48 6.00(H) 4.97  HGB 13.0 - 17.7 g/dL 31.5 17.2(H) 15.1  HCT 37.5 - 51.0 % 47.3 50.2 42.8  PLT 150 - 450 x10E3/uL 358 130(L) 239  NEUTROABS 1.4 - 7.0 x10E3/uL 15.6(H) - 12.1(H)  LYMPHSABS 0.7 - 3.1 x10E3/uL 2.1 - 0.9     There is no height or weight on file to calculate BMI.  Orders:  No orders of the defined types were placed in this encounter.  No orders of the defined types were placed in this encounter.    Procedures: No procedures performed  Clinical Data: No additional findings.  ROS:  All other systems negative, except as noted in the HPI. Review of Systems  Objective: Vital Signs: There were no vitals taken for this visit.  Specialty Comments:  No specialty comments available.  PMFS History: There are no problems to display for this patient.  Past Medical History:  Diagnosis Date  . Diabetes mellitus without complication (HCC)   . Kidney stone   . Kidney stone   . Peripheral neuropathy     Family History  Problem Relation Age of Onset  . Diabetes Mother   . Diabetes Father   . Diabetes Brother     History reviewed. No pertinent surgical history. Social History   Occupational History  . Not on file  Tobacco Use  . Smoking status: Never Smoker  . Smokeless tobacco: Never Used  Vaping Use  . Vaping Use: Never used  Substance and Sexual Activity  . Alcohol use: Not  Currently  . Drug use: Never  . Sexual activity: Not on file

## 2021-04-28 NOTE — Progress Notes (Signed)
PCP - scott boyd - [facility] [place]   Chest x-ray - denies EKG - DOS Stress Test - denies ECHO - denies Cardiac Cath - denies  Fasting Blood Sugar - doesn't know Checks Blood Sugar __never___ times a day  a1c - 9.8 per patient was drawn last week No jardiance today   ERAS Protcol - clears until 0800   COVID TEST- result pending   Anesthesia review: yes elevated a1c requesting  Patient denies shortness of breath, fever, cough and chest pain at PAT appointment   All instructions explained to the patient, with a verbal understanding of the material. Patient agrees to go over the instructions while at home for a better understanding. Patient also instructed to self quarantine after being tested for COVID-19. The opportunity to ask questions was provided.

## 2021-04-29 ENCOUNTER — Other Ambulatory Visit: Payer: Self-pay

## 2021-04-29 ENCOUNTER — Ambulatory Visit (HOSPITAL_COMMUNITY): Payer: 59 | Admitting: Physician Assistant

## 2021-04-29 ENCOUNTER — Ambulatory Visit (HOSPITAL_COMMUNITY)
Admission: RE | Admit: 2021-04-29 | Discharge: 2021-04-29 | Disposition: A | Discharge status: Home / Self Care | Payer: 59 | Attending: Orthopedic Surgery | Admitting: Orthopedic Surgery

## 2021-04-29 ENCOUNTER — Encounter (HOSPITAL_COMMUNITY): Payer: Self-pay | Admitting: Orthopedic Surgery

## 2021-04-29 ENCOUNTER — Encounter (HOSPITAL_COMMUNITY)
Admission: RE | Disposition: A | Discharge status: Home / Self Care | Payer: Self-pay | Source: Home / Self Care | Attending: Orthopedic Surgery

## 2021-04-29 DIAGNOSIS — Z880 Allergy status to penicillin: Secondary | ICD-10-CM | POA: Diagnosis not present

## 2021-04-29 DIAGNOSIS — M868X7 Other osteomyelitis, ankle and foot: Secondary | ICD-10-CM | POA: Insufficient documentation

## 2021-04-29 DIAGNOSIS — M869 Osteomyelitis, unspecified: Secondary | ICD-10-CM

## 2021-04-29 DIAGNOSIS — L02612 Cutaneous abscess of left foot: Secondary | ICD-10-CM | POA: Diagnosis not present

## 2021-04-29 HISTORY — DX: Dyspnea, unspecified: R06.00

## 2021-04-29 HISTORY — DX: Personal history of urinary calculi: Z87.442

## 2021-04-29 HISTORY — PX: AMPUTATION: SHX166

## 2021-04-29 LAB — GLUCOSE, CAPILLARY
Glucose-Capillary: 289 mg/dL — ABNORMAL HIGH (ref 70–99)
Glucose-Capillary: 273 mg/dL — ABNORMAL HIGH (ref 70–99)
Glucose-Capillary: 240 mg/dL — ABNORMAL HIGH (ref 70–99)
Glucose-Capillary: 228 mg/dL — ABNORMAL HIGH (ref 70–99)

## 2021-04-29 LAB — BASIC METABOLIC PANEL
Anion gap: 11 (ref 5–15)
BUN: 12 mg/dL (ref 6–20)
CO2: 19 mmol/L — ABNORMAL LOW (ref 22–32)
Calcium: 8.7 mg/dL — ABNORMAL LOW (ref 8.9–10.3)
Chloride: 101 mmol/L (ref 98–111)
Creatinine, Ser: 0.93 mg/dL (ref 0.61–1.24)
GFR, Estimated: >60 mL/min (ref >60)
Glucose, Bld: 286 mg/dL — ABNORMAL HIGH (ref 70–99)
Potassium: 4.7 mmol/L (ref 3.5–5.1)
Sodium: 131 mmol/L — ABNORMAL LOW (ref 135–145)

## 2021-04-29 LAB — SARS CORONAVIRUS 2 (TAT 6-24 HRS): SARS Coronavirus 2: NEGATIVE

## 2021-04-29 SURGERY — AMPUTATION DIGIT
Anesthesia: Regional | Site: Toe | Laterality: Left

## 2021-04-29 MED ORDER — INSULIN ASPART 100 UNIT/ML IJ SOLN
5.0000 [IU] | Freq: Once | INTRAMUSCULAR | Status: AC
  Administered 2021-04-29: 5 [IU] via SUBCUTANEOUS

## 2021-04-29 MED ORDER — OXYCODONE HCL 5 MG/5ML PO SOLN
5.0000 mg | Freq: Once | ORAL | Status: DC | PRN
Start: 2021-04-29 — End: 2021-05-01

## 2021-04-29 MED ORDER — MIDAZOLAM HCL 2 MG/2ML IJ SOLN: 2.0000 mg | Freq: Once | INTRAMUSCULAR | Status: AC

## 2021-04-29 MED ORDER — LIDOCAINE-EPINEPHRINE (PF) 1.5 %-1:200000 IJ SOLN
INTRAMUSCULAR | Status: DC | PRN
  Administered 2021-04-29: 30 mL via PERINEURAL

## 2021-04-29 MED ORDER — AMISULPRIDE (ANTIEMETIC) 5 MG/2ML IV SOLN: 10.0000 mg | Freq: Once | INTRAVENOUS | Status: DC | PRN

## 2021-04-29 MED ORDER — LIDOCAINE 2% (20 MG/ML) 5 ML SYRINGE
INTRAMUSCULAR | Status: AC
  Filled 2021-04-29: qty 5

## 2021-04-29 MED ORDER — ACETAMINOPHEN 10 MG/ML IV SOLN: 1000.0000 mg | Freq: Once | INTRAVENOUS | Status: DC | PRN

## 2021-04-29 MED ORDER — CLINDAMYCIN PHOSPHATE 900 MG/50ML IV SOLN
900.0000 mg | INTRAVENOUS | Status: AC
  Administered 2021-04-29: 900 mg via INTRAVENOUS
  Filled 2021-04-29: qty 50

## 2021-04-29 MED ORDER — FENTANYL CITRATE (PF) 100 MCG/2ML IJ SOLN
INTRAMUSCULAR | Status: AC
  Filled 2021-04-29: qty 2

## 2021-04-29 MED ORDER — 0.9 % SODIUM CHLORIDE (POUR BTL) OPTIME
TOPICAL | Status: DC | PRN
  Administered 2021-04-29: 1000 mL

## 2021-04-29 MED ORDER — DEXMEDETOMIDINE (PRECEDEX) IN NS 20 MCG/5ML (4 MCG/ML) IV SYRINGE
PREFILLED_SYRINGE | INTRAVENOUS | Status: AC
  Filled 2021-04-29: qty 5

## 2021-04-29 MED ORDER — CHLORHEXIDINE GLUCONATE 0.12 % MT SOLN: 15.0000 mL | Freq: Once | OROMUCOSAL | Status: AC

## 2021-04-29 MED ORDER — CHLORHEXIDINE GLUCONATE 0.12 % MT SOLN
OROMUCOSAL | Status: AC
  Administered 2021-04-29: 15 mL via OROMUCOSAL
  Filled 2021-04-29: qty 15

## 2021-04-29 MED ORDER — OXYCODONE HCL 5 MG PO TABS: 5.0000 mg | ORAL_TABLET | Freq: Once | ORAL | Status: DC | PRN

## 2021-04-29 MED ORDER — KETOROLAC TROMETHAMINE 30 MG/ML IJ SOLN: 30.0000 mg | Freq: Once | INTRAMUSCULAR | Status: DC

## 2021-04-29 MED ORDER — INSULIN ASPART 100 UNIT/ML IJ SOLN
INTRAMUSCULAR | Status: AC
  Filled 2021-04-29: qty 1

## 2021-04-29 MED ORDER — FENTANYL CITRATE (PF) 100 MCG/2ML IJ SOLN: 25.0000 ug | INTRAMUSCULAR | Status: DC | PRN

## 2021-04-29 MED ORDER — PROPOFOL 10 MG/ML IV BOLUS
INTRAVENOUS | Status: DC | PRN
  Administered 2021-04-29 (×2): 20 mg via INTRAVENOUS
  Administered 2021-04-29 (×2): 10 mg via INTRAVENOUS
  Administered 2021-04-29: 20 mg via INTRAVENOUS
  Administered 2021-04-29 (×2): 10 mg via INTRAVENOUS
  Administered 2021-04-29 (×2): 20 mg via INTRAVENOUS

## 2021-04-29 MED ORDER — LACTATED RINGERS IV SOLN: INTRAVENOUS | Status: DC

## 2021-04-29 MED ORDER — DEXMEDETOMIDINE (PRECEDEX) IN NS 20 MCG/5ML (4 MCG/ML) IV SYRINGE
PREFILLED_SYRINGE | INTRAVENOUS | Status: DC | PRN
Start: 2021-04-29 — End: 2021-04-29
  Administered 2021-04-29 (×5): 4 ug via INTRAVENOUS

## 2021-04-29 MED ORDER — OXYCODONE-ACETAMINOPHEN 5-325 MG PO TABS: 1.0000 | ORAL_TABLET | ORAL | 0 refills | Status: DC | PRN

## 2021-04-29 MED ORDER — PROMETHAZINE HCL 25 MG/ML IJ SOLN: 6.2500 mg | INTRAMUSCULAR | Status: DC | PRN

## 2021-04-29 MED ORDER — PROPOFOL 10 MG/ML IV BOLUS
INTRAVENOUS | Status: AC
  Filled 2021-04-29: qty 20

## 2021-04-29 MED ORDER — ORAL CARE MOUTH RINSE: 15.0000 mL | Freq: Once | OROMUCOSAL | Status: AC

## 2021-04-29 MED ORDER — MIDAZOLAM HCL 2 MG/2ML IJ SOLN
INTRAMUSCULAR | Status: AC
  Administered 2021-04-29: 2 mg via INTRAVENOUS
  Filled 2021-04-29: qty 2

## 2021-04-29 SURGICAL SUPPLY — 29 items
BLADE SURG 21 STRL SS (BLADE) ×2 IMPLANT
BNDG COHESIVE 4X5 TAN STRL (GAUZE/BANDAGES/DRESSINGS) ×2 IMPLANT
BNDG ESMARK 4X9 LF (GAUZE/BANDAGES/DRESSINGS) IMPLANT
BNDG GAUZE ELAST 4 BULKY (GAUZE/BANDAGES/DRESSINGS) ×2 IMPLANT
COVER SURGICAL LIGHT HANDLE (MISCELLANEOUS) ×2 IMPLANT
COVER WAND RF STERILE (DRAPES) IMPLANT
DRAPE U-SHAPE 47X51 STRL (DRAPES) ×2 IMPLANT
DRSG ADAPTIC 3X8 NADH LF (GAUZE/BANDAGES/DRESSINGS) IMPLANT
DRSG EMULSION OIL 3X3 NADH (GAUZE/BANDAGES/DRESSINGS) ×2 IMPLANT
DRSG PAD ABDOMINAL 8X10 ST (GAUZE/BANDAGES/DRESSINGS) ×2 IMPLANT
DURAPREP 26ML APPLICATOR (WOUND CARE) ×2 IMPLANT
ELECT REM PT RETURN 9FT ADLT (ELECTROSURGICAL) ×2
ELECTRODE REM PT RTRN 9FT ADLT (ELECTROSURGICAL) ×1 IMPLANT
GAUZE SPONGE 4X4 12PLY STRL (GAUZE/BANDAGES/DRESSINGS) ×2 IMPLANT
GLOVE BIOGEL PI IND STRL 9 (GLOVE) ×1 IMPLANT
GLOVE BIOGEL PI INDICATOR 9 (GLOVE) ×1
GLOVE SURG ORTHO 9.0 STRL STRW (GLOVE) ×2 IMPLANT
GOWN STRL REUS W/ TWL XL LVL3 (GOWN DISPOSABLE) ×2 IMPLANT
GOWN STRL REUS W/TWL XL LVL3 (GOWN DISPOSABLE) ×2
KIT BASIN OR (CUSTOM PROCEDURE TRAY) ×2 IMPLANT
KIT TURNOVER KIT B (KITS) ×2 IMPLANT
MANIFOLD NEPTUNE II (INSTRUMENTS) ×2 IMPLANT
NEEDLE 22X1 1/2 (OR ONLY) (NEEDLE) IMPLANT
NS IRRIG 1000ML POUR BTL (IV SOLUTION) ×2 IMPLANT
PACK ORTHO EXTREMITY (CUSTOM PROCEDURE TRAY) ×2 IMPLANT
PAD ARMBOARD 7.5X6 YLW CONV (MISCELLANEOUS) ×4 IMPLANT
SUT ETHILON 2 0 PSLX (SUTURE) ×4 IMPLANT
SYR CONTROL 10ML LL (SYRINGE) IMPLANT
TOWEL GREEN STERILE (TOWEL DISPOSABLE) ×2 IMPLANT

## 2021-04-29 NOTE — H&P (Signed)
John Church is an 45 y.o. male.   Chief Complaint: Left great toe osteomyelitis HPI: Patient is a 45 year old gentleman who presents status post debridement of an abscess paronychial left great toe he was started on doxycycline cultures were obtained and showing gram-positive cocci in chains.  Patient complains of increased redness pain and swelling.  Past Medical History:  Diagnosis Date  . COVID-19 2020  . Diabetes mellitus without complication (HCC)    type 2  . Dyspnea    wtih exertion  . History of kidney stones   . Peripheral neuropathy     Past Surgical History:  Procedure Laterality Date  . MULTIPLE TOOTH EXTRACTIONS      Family History  Problem Relation Age of Onset  . Diabetes Mother   . Diabetes Father   . Diabetes Brother    Social History:  reports that he has never smoked. He has never used smokeless tobacco. He reports previous alcohol use. He reports that he does not use drugs.  Allergies:  Allergies  Allergen Reactions  . Penicillins     Did it involve swelling of the face/tongue/throat, SOB, or low BP? Unknown Did it involve sudden or severe rash/hives, skin peeling, or any reaction on the inside of your mouth or nose? Unknown Did you need to seek medical attention at a hospital or doctor's office? Unknown When did it last happen? 45 year old If all above answers are "NO", may proceed with cephalosporin use.   CHILDHOOD ALLERGY    No medications prior to admission.    Results for orders placed or performed during the hospital encounter of 04/28/21 (from the past 48 hour(s))  SARS CORONAVIRUS 2 (TAT 6-24 HRS) Nasopharyngeal Nasopharyngeal Swab     Status: None   Collection Time: 04/28/21  2:15 PM   Specimen: Nasopharyngeal Swab  Result Value Ref Range   SARS Coronavirus 2 NEGATIVE NEGATIVE    Comment: (NOTE) SARS-CoV-2 target nucleic acids are NOT DETECTED.  The SARS-CoV-2 RNA is generally detectable in upper and lower respiratory  specimens during the acute phase of infection. Negative results do not preclude SARS-CoV-2 infection, do not rule out co-infections with other pathogens, and should not be used as the sole basis for treatment or other patient management decisions. Negative results must be combined with clinical observations, patient history, and epidemiological information. The expected result is Negative.  Fact Sheet for Patients: HairSlick.no  Fact Sheet for Healthcare Providers: quierodirigir.com  This test is not yet approved or cleared by the [place] FDA and  has been authorized for detection and/or diagnosis of SARS-CoV-2 by FDA under an Emergency Use Authorization (EUA). This EUA will remain  in effect (meaning this test can be used) for the duration of the COVID-19 declaration under Se ction 564(b)(1) of the Act, 21 U.S.C. section 360bbb-3(b)(1), unless the authorization is terminated or revoked sooner.  Performed at [facility], [address], [place], [place] [address]    No results found.  Review of Systems  All other systems reviewed and are negative.   There were no vitals taken for this visit. Physical Exam  Patient is alert, oriented, no adenopathy, well-dressed, normal affect, normal respiratory effort. Examination patient has a good dorsalis pedis and posterior tibial pulse.  He now has a purulent draining ulcer from the tip of the great toe this probes down to the tuft of the great toe with a large dead space.  The cellulitis beneath the area of the paronychial infection  has not resolved and there is purulent drainage the cellulitis extends to the MTP joint with sausage digit swelling.Heart RRR Lungs Clear Assessment/Plan 1. Osteomyelitis of great toe of left foot (HCC)   2. Abscess of left great toe     Plan: Due to the progression of the infection with exposed bone.  No abscess we will plan for a  amputation of the great toe through the MTP joint.  Plan for outpatient surgery minimize weightbearing postoperatively risk and benefits were discussed including risk of the wound not healing need for additional surgery.  Patient states he understands wished to proceed at this time.   [place] Meili Kleckley, PA 04/29/2021, 6:38 AM

## 2021-04-29 NOTE — Progress Notes (Signed)
Orthopedic Tech Progress Note Patient Details:  John Church Oct 05, 1976 [id number]  Ortho Devices Type of Ortho Device: Crutches Ortho Device/Splint Interventions: Ordered   Post Interventions Patient Tolerated: Well Instructions Provided: Adjustment of device   Maurene Capes 04/29/2021, 2:18 PM

## 2021-04-29 NOTE — Op Note (Signed)
04/29/2021  12:27 PM  PATIENT:  John Church    PRE-OPERATIVE DIAGNOSIS:  Osteomyelitis, Abscess LEFT  Foot  POST-OPERATIVE DIAGNOSIS:  Same  PROCEDURE:  LEFT GREAT TOE AMPUTATION  SURGEON:  [name], MD  PHYSICIAN ASSISTANT:None ANESTHESIA:   General  PREOPERATIVE INDICATIONS:  [name] is a  45 y.o. male with a diagnosis of Osteomyelitis, Abscess LEFT  Foot who failed conservative measures and elected for surgical management.    The risks benefits and alternatives were discussed with the patient preoperatively including but not limited to the risks of infection, bleeding, nerve injury, cardiopulmonary complications, the need for revision surgery, among others, and the patient was willing to proceed.  OPERATIVE IMPLANTS: none  @ENCIMAGES @  OPERATIVE FINDINGS: Wound margins clear no infection  OPERATIVE PROCEDURE: Patient was brought the operating room after undergoing a regional anesthetic.  After adequate levels anesthesia were obtained patient's left lower extremity was prepped using DuraPrep draped into a sterile field a timeout was called.  A fishmouth incision was made just distal to the MTP joint left great toe the toe was amputated through the MTP joint.  Electrocautery was used for hemostasis the wound was irrigated with normal saline incision was closed using 2-0 nylon and a sterile dressing was applied patient was taken the PACU in stable condition   DISCHARGE PLANNING:  Antibiotic duration: Preoperative antibiotics  Weightbearing: Touchdown weightbearing on the left  Pain medication: Prescription for Percocet  Dressing care/ Wound VAC: Dry dressing  Ambulatory devices: Walker, crutches  Discharge to: Home.  Follow-up: In the office 1 week post operative.

## 2021-04-29 NOTE — Transfer of Care (Signed)
Immediate Anesthesia Transfer of Care Note  Patient: John Church  Procedure(s) Performed: LEFT GREAT TOE AMPUTATION (Left Toe)  Patient Location: PACU  Anesthesia Type:MAC and Regional  Level of Consciousness: awake, alert  and oriented  Airway & Oxygen Therapy: Patient Spontanous Breathing  Post-op Assessment: Report given to RN and Post -op Vital signs reviewed and stable  Post vital signs: Reviewed and stable  Last Vitals:  Vitals Value Taken Time  BP 123/83 04/29/21 1210  Temp 36.4 C 04/29/21 1210  Pulse 101 04/29/21 1213  Resp 24 04/29/21 1213  SpO2 96 % 04/29/21 1213  Vitals shown include unvalidated device data.  Last Pain:  Vitals:   04/29/21 0840  TempSrc:   PainSc: 0-No pain         Complications: No complications documented.

## 2021-04-29 NOTE — Interval H&P Note (Signed)
History and Physical Interval Note:  04/29/2021 10:52 AM  John Church  has presented today for surgery, with the diagnosis of Osteomyelitis, Abscess LEFT  Foot.  The various methods of treatment have been discussed with the patient and family. After consideration of risks, benefits and other options for treatment, the patient has consented to  Procedure(s): LEFT GREAT TOE AMPUTATION (Left) as a surgical intervention.  The patient's history has been reviewed, patient examined, no change in status, stable for surgery.  I have reviewed the patient's chart and labs.  Questions were answered to the patient's satisfaction.     [name]

## 2021-04-29 NOTE — Progress Notes (Signed)
Patient received additional 5 units Novolog s.c.  @ 11:36 o'clock per Dr. Bradley Ferris verbal order.

## 2021-04-29 NOTE — Progress Notes (Addendum)
CBG this AM 289 (08:18 o'clock). Patient asymptomatic, denied any discomfort. Per Dr. Bradley Ferris verbal order patient received Novolog 5 units s.c. at 08:52 o'clock.

## 2021-04-29 NOTE — Anesthesia Preprocedure Evaluation (Addendum)
Anesthesia Evaluation  Patient identified by MRN, date of birth, ID band Patient awake    Reviewed: Allergy & Precautions, NPO status , Patient's Chart, lab work & pertinent test results  Airway Mallampati: III  TM Distance: >3 FB Neck ROM: Full    Dental  (+) Chipped,    Pulmonary neg pulmonary ROS,    Pulmonary exam normal breath sounds clear to auscultation       Cardiovascular negative cardio ROS Normal cardiovascular exam Rhythm:Regular Rate:Normal  ECG: ST, rate 105. RBBB   Neuro/Psych negative neurological ROS  negative psych ROS   GI/Hepatic negative GI ROS, Neg liver ROS,   Endo/Other  diabetes, Oral Hypoglycemic Agents  Renal/GU negative Renal ROS     Musculoskeletal negative musculoskeletal ROS (+)   Abdominal (+) + obese,   Peds  Hematology negative hematology ROS (+)   Anesthesia Other Findings Osteomyelitis, Abscess LEFT  Foot  Reproductive/Obstetrics                            Anesthesia Physical Anesthesia Plan  ASA: III  Anesthesia Plan: Regional   Post-op Pain Management:    Induction: Intravenous  PONV Risk Score and Plan: 1 and Ondansetron, Dexamethasone, Midazolam and Treatment may vary due to age or medical condition  Airway Management Planned: Simple Face Mask  Additional Equipment:   Intra-op Plan:   Post-operative Plan:   Informed Consent: I have reviewed the patients History and Physical, chart, labs and discussed the procedure including the risks, benefits and alternatives for the proposed anesthesia with the patient or authorized representative who has indicated his/her understanding and acceptance.     Dental advisory given  Plan Discussed with: CRNA  Anesthesia Plan Comments:         Anesthesia Quick Evaluation

## 2021-04-29 NOTE — Discharge Instructions (Signed)
Regional Anesthesia  Regional anesthesia is a method used to temporarily block feeling in one area of the body. You may have regional anesthesia before a medical procedure or surgery. A health care provider who specializes in giving anesthesia (anesthesiologist) injects a type of medicine near a nerve or a group of nerves. This medicine makes that area of the body numb. Regional anesthesia allows you to be awake during the procedure or surgery but keeps you from feeling pain in the affected area. There are three types of regional anesthesia:  Spinal anesthesia. This is a one-time injection of medicine into the fluid that surrounds your spinal cord. This numbs the area below and slightly above the injection site.  Epidural anesthesia. This is another medicine that may be placed into your back, but just outside of the protective tissue that covers your spinal cord. Instead of a one-time injection, the medicine is often given gradually over time through a small tube (catheter)that remains in your back for as long as pain control is needed.  Peripheral nerve block. This is an injection that is given in an area of the body other than the spine to block all feeling below the injection site. Peripheral nerve blocks may be given as a single injection before your procedure or may be given through a catheter for as long as you need pain control. Regional anesthesia can be used alone or in combination with other types of anesthesia. Compared to using medicine that makes you fall asleep (general anesthetic), regional anesthesia has many benefits, such as:  Improved pain control after your surgery.  Less nausea, vomiting, or drowsiness after surgery.  A faster recovery. Tell a health care provider about:  Any allergies you have.  All medicines you are taking, including vitamins, herbs, eye drops, creams, and over-the-counter medicines.  Any use of drugs, alcohol, or tobacco.  Any problems you or family  members have had with anesthetic medicines.  Any blood disorders you have.  Any surgeries you have had.  Any medical conditions you have or have had, especially heart failure, chronic obstructive pulmonary disease (COPD), or sleep apnea.  Whether you are pregnant or may be pregnant. What are the risks? Generally, this is a safe procedure. However, problems may occur, including:  Pain.  Nausea.  Vomiting.  Itching.  Low blood pressure.  Headache.  Nerve damage.  Infection.  Bleeding around the injection site.  Trouble urinating.  Allergic reactions to medicine. What happens before the procedure? Staying hydrated Follow instructions from your health care provider about hydration, which may include:  Up to 2 hours before the procedure - you may continue to drink clear liquids, such as water, clear fruit juice, black coffee, and plain tea. Eating and drinking restrictions Follow instructions from your health care provider about eating and drinking, which may include:  8 hours before the procedure - stop eating heavy meals or foods, such as meat, fried foods, or fatty foods.  6 hours before the procedure - stop eating light meals or foods, such as toast or cereal.  6 hours before the procedure - stop drinking milk or drinks that contain milk.  2 hours before the procedure - stop drinking clear liquids. Medicines Ask your health care provider about:  Changing or stopping your regular medicines. This is especially important if you are taking diabetes medicines or blood thinners.  Taking medicines such as aspirin and ibuprofen. These medicines can thin your blood. Do not take these medicines unless your health care provider   tells you to take them.  Taking over-the-counter medicines, vitamins, herbs, and supplements. General instructions  Plan to have a responsible adult take you home from the hospital or clinic.  If you will be going home right after the procedure,  plan to have a responsible adult care for you for the time you are told. This is important.  You may need to have blood or imaging tests.  Ask your health care provider what steps will be taken to help prevent infection. These may include washing skin with a germ-killing soap.  If you use a sleep apnea device, ask your health care provider whether you should bring it with you on the day of your surgery. What happens during the procedure?  Depending on the medical procedure you are having done, an IV may be inserted into one of your veins.  The anesthesiologist will do a physical exam to find the best location to give the regional anesthesia. To locate the nerve, he or she may also use: ? A device that activates the nerve and causes your muscles to twitch (nerve stimulator). ? An imaging tool that uses sound waves to create images of the area (ultrasound).  You may be given a medicine to help you relax (sedative).  A medicine called a local anesthetic may be injected to numb the area where the regional anesthetic will be injected.  You will get regional anesthesia by injection or through a catheter.  The anesthesiologist will check to make sure the medicine is working before the rest of your medical procedure begins.  Depending on the type of regional anesthesia you received, you may have a small bandage (dressing) placed over the injection site. The procedure may vary among health care providers and hospitals. What can I expect after the procedure? After your procedure, it is common to have:  Sleepiness.  Nausea.  Itching.  Numbness.  Shivering or feeling cold. Your blood pressure, heart rate, breathing rate, and blood oxygen level will be monitored until you leave the hospital or clinic. Follow these instructions at home:  If you were given a sedative during the procedure, it can affect you for several hours. Do not drive or operate machinery until your health care provider  says that it is safe.  Take over-the-counter and prescription medicines only as told by your health care provider.  Do not drive, exercise, or do any other activities that require coordination as told by your health care provider. Ask your health care provider when you can return to your usual activities.  Drink enough fluid to keep your urine pale yellow.  If you had a dressing placed over the injection site, only remove it when told to do so by your health care provider.  Keep all follow-up visits as told by your health care provider. This is important. Contact a health care provider if you:  Continue to have nausea and vomiting for more than 1 day.  Develop a rash.  Have trouble urinating. Get help right away if you:  Have bleeding from the injection site or bleeding under the skin at the injection site.  Have redness, swelling, or pain around your injection site.  Have a fever.  Develop a headache.  Develop new numbness or weakness. Summary  Regional anesthesia is a method used to temporarily block feeling in one area of the body. It may be done to block pain during a medical procedure or surgery.  Follow instructions from your health care provider about taking medicines and  about eating and drinking before the procedure.  Ask your health care provider when you can return to your usual activities after the procedure. This information is not intended to replace advice given to you by your health care provider. Make sure you discuss any questions you have with your health care provider. Document Revised: 04/09/2020 Document Reviewed: 01/27/2019 Elsevier Patient Education  2021 [employer] Monitored Anesthesia Care Anesthesia refers to techniques, procedures, and medicines that help a person stay safe and comfortable during a medical or dental procedure. Monitored anesthesia care, or sedation, is one type of anesthesia. Your anesthesia specialist may recommend sedation if  you will be having a procedure that does not require you to be unconscious. You may have this procedure for:  Cataract surgery.  A dental procedure.  A biopsy.  A colonoscopy. During the procedure, you may receive a medicine to help you relax (sedative). There are three levels of sedation:  Mild sedation. At this level, you may feel awake and relaxed. You will be able to follow directions.  Moderate sedation. At this level, you will be sleepy. You may not remember the procedure.  Deep sedation. At this level, you will be asleep. You will not remember the procedure. The more medicine you are given, the deeper your level of sedation will be. Depending on how you respond to the procedure, the anesthesia specialist may change your level of sedation or the type of anesthesia to fit your needs. An anesthesia specialist will monitor you closely during the procedure. Tell a health care provider about:  Any allergies you have.  All medicines you are taking, including vitamins, herbs, eye drops, creams, and over-the-counter medicines.  Any problems you or family members have had with anesthetic medicines.  Any blood disorders you have.  Any surgeries you have had.  Any medical conditions you have, such as sleep apnea.  Whether you are pregnant or may be pregnant.  Whether you use cigarettes, alcohol, or drugs.  Any use of steroids, whether by mouth or as a cream. What are the risks? Generally, this is a safe procedure. However, problems may occur, including:  Getting too much medicine (oversedation).  Nausea.  Allergic reaction to medicines.  Trouble breathing. If this happens, a breathing tube may be used to help with breathing. It will be removed when you are awake and breathing on your own.  Heart trouble.  Lung trouble.  Confusion that gets better with time (emergence delirium). What happens before the procedure? Staying hydrated Follow instructions from your health  care provider about hydration, which may include:  Up to 2 hours before the procedure - you may continue to drink clear liquids, such as water, clear fruit juice, black coffee, and plain tea. Eating and drinking restrictions Follow instructions from your health care provider about eating and drinking, which may include:  8 hours before the procedure - stop eating heavy meals or foods, such as meat, fried foods, or fatty foods.  6 hours before the procedure - stop eating light meals or foods, such as toast or cereal.  6 hours before the procedure - stop drinking milk or drinks that contain milk.  2 hours before the procedure - stop drinking clear liquids. Medicines Ask your health care provider about:  Changing or stopping your regular medicines. This is especially important if you are taking diabetes medicines or blood thinners.  Taking medicines such as aspirin and ibuprofen. These medicines can thin your blood. Do not take these medicines unless your  health care provider tells you to take them.  Taking over-the-counter medicines, vitamins, herbs, and supplements. Tests and exams  You will have a physical exam.  You may have blood tests done to show: ? How well your kidneys and liver are working. ? How well your blood can clot. General instructions  Plan to have a responsible adult take you home from the hospital or clinic.  If you will be going home right after the procedure, plan to have a responsible adult care for you for the time you are told. This is important. What happens during the procedure?  Your blood pressure, heart rate, breathing, level of pain, and overall condition will be monitored.  An IV will be inserted into one of your veins.  You will be given medicines as needed to keep you comfortable during the procedure. This may mean changing the level of sedation. ? Depending on your age or the procedure, the sedative may be given:  As a pill that you will  swallow or as a pill that is inserted into the rectum.  As an injection into the vein or muscle.  As a spray through the nose.  The procedure will be performed.  Your breathing, heart rate, and blood pressure will be monitored during the procedure.  When the procedure is over, the medicine will be stopped. The procedure may vary among health care providers and hospitals.   What happens after the procedure?  Your blood pressure, heart rate, breathing rate, and blood oxygen level will be monitored until you leave the hospital or clinic.  You may feel sleepy, clumsy, or nauseous.  You may feel forgetful about what happened after the procedure.  You may vomit.  You may continue to get IV fluids.  Do not drive or operate machinery until your health care provider says that it is safe. Summary  Monitored anesthesia care is used to keep a patient comfortable during short procedures.  Tell your health care provider about any allergies or health conditions you have and about all the medicines you are taking.  Before the procedure, follow instructions about when to stop eating and drinking and about changing or stopping any medicines.  Your blood pressure, heart rate, breathing rate, and blood oxygen level will be monitored until you leave the hospital or clinic.  Plan to have a responsible adult take you home from the hospital or clinic. This information is not intended to replace advice given to you by your health care provider. Make sure you discuss any questions you have with your health care provider. Document Revised: 08/26/2020 Document Reviewed: 11/13/2019 Elsevier Patient Education  2021 [employer].

## 2021-04-29 NOTE — Anesthesia Procedure Notes (Signed)
Anesthesia Regional Block: Popliteal block   Pre-Anesthetic Checklist: ,, timeout performed, Correct Patient, Correct Site, Correct Laterality, Correct Procedure,, site marked, risks and benefits discussed, Surgical consent,  Pre-op evaluation,  At surgeon's request and post-op pain management  Laterality: Left  Prep: chloraprep       Needles:  Injection technique: Single-shot  Needle Type: Echogenic Stimulator Needle     Needle Length: 10cm  Needle Gauge: 20     Additional Needles:   Procedures:,,,, ultrasound used (permanent image in chart),,,,  Narrative:  Start time: 04/29/2021 11:10 AM End time: 04/29/2021 11:20 AM Injection made incrementally with aspirations every 5 mL.  Performed by: Personally  Anesthesiologist: Leonides Grills, MD  Additional Notes: Functioning IV was confirmed and monitors were applied.  A 20ga BBraun echogenic stimulator needle was used. Sterile prep and drape,hand hygiene and sterile gloves were used.  Negative aspiration and negative test dose prior to incremental administration of local anesthetic. The patient tolerated the procedure well.

## 2021-04-29 NOTE — Progress Notes (Signed)
CBG 240 @ 10:13.

## 2021-04-30 NOTE — Anesthesia Postprocedure Evaluation (Signed)
Anesthesia Post Note  Patient: John Church  Procedure(s) Performed: LEFT GREAT TOE AMPUTATION (Left Toe)     Patient location during evaluation: PACU Anesthesia Type: Regional Level of consciousness: awake Pain management: pain level controlled Vital Signs Assessment: post-procedure vital signs reviewed and stable Respiratory status: spontaneous breathing, nonlabored ventilation, respiratory function stable and patient connected to nasal cannula oxygen Cardiovascular status: stable and blood pressure returned to baseline Postop Assessment: no apparent nausea or vomiting Anesthetic complications: no   No complications documented.  Last Vitals:  Vitals:   04/29/21 1215 04/29/21 1225  BP:  130/77  Pulse: (!) 103 95  Resp: 19 20  Temp:  36.4 C  SpO2: 96% 97%    Last Pain:  Vitals:   04/29/21 1210  TempSrc:   PainSc: 0-No pain                 Genita Nilsson P Deana Krock

## 2021-05-01 ENCOUNTER — Encounter (HOSPITAL_COMMUNITY): Payer: Self-pay | Admitting: Orthopedic Surgery

## 2021-05-04 ENCOUNTER — Ambulatory Visit: Payer: 59 | Admitting: Nurse Practitioner

## 2021-05-04 ENCOUNTER — Other Ambulatory Visit: Payer: Self-pay

## 2021-05-04 ENCOUNTER — Encounter: Payer: Self-pay | Admitting: Nurse Practitioner

## 2021-05-04 VITALS — BP 141/92 | HR 98 | Ht 73.0 in | Wt 231.0 lb

## 2021-05-04 DIAGNOSIS — I10 Essential (primary) hypertension: Secondary | ICD-10-CM

## 2021-05-04 DIAGNOSIS — E782 Mixed hyperlipidemia: Secondary | ICD-10-CM | POA: Diagnosis not present

## 2021-05-04 DIAGNOSIS — E1165 Type 2 diabetes mellitus with hyperglycemia: Secondary | ICD-10-CM | POA: Diagnosis not present

## 2021-05-04 MED ORDER — GLIPIZIDE ER 5 MG PO TB24: 5.0000 mg | ORAL_TABLET | Freq: Every day | ORAL | 3 refills | Status: DC

## 2021-05-04 MED ORDER — ACCU-CHEK GUIDE VI STRP: ORAL_STRIP | 12 refills | Status: DC

## 2021-05-04 NOTE — Progress Notes (Signed)
Endocrinology Consult Note       05/04/2021, 12:32 PM   Subjective:    Patient ID: John Church, male    DOB: January 06, 1976.  John Church is being seen in consultation for management of currently uncontrolled symptomatic diabetes requested by  Lovey Newcomer, PA.   Past Medical History:  Diagnosis Date  . COVID-19 2020  . Diabetes mellitus without complication (HCC)    type 2  . Dyspnea    wtih exertion  . History of kidney stones   . Peripheral neuropathy     Past Surgical History:  Procedure Laterality Date  . AMPUTATION Left 04/29/2021   Procedure: LEFT GREAT TOE AMPUTATION;  Surgeon: [name], MD;  Location: [facility] OR;  Service: Orthopedics;  Laterality: Left;  Marland Kitchen MULTIPLE TOOTH EXTRACTIONS      Social History   Socioeconomic History  . Marital status: Single    Spouse name: Not on file  . Number of children: Not on file  . Years of education: Not on file  . Highest education level: Not on file  Occupational History  . Not on file  Tobacco Use  . Smoking status: Never Smoker  . Smokeless tobacco: Never Used  Vaping Use  . Vaping Use: Never used  Substance and Sexual Activity  . Alcohol use: Not Currently  . Drug use: Never  . Sexual activity: Not on file  Other Topics Concern  . Not on file  Social History Narrative  . Not on file   Social Determinants of Health   Financial Resource Strain: Not on file  Food Insecurity: Not on file  Transportation Needs: Not on file  Physical Activity: Not on file  Stress: Not on file  Social Connections: Not on file    Family History  Problem Relation Age of Onset  . Diabetes Mother   . Diabetes Father   . Diabetes Brother     Outpatient Encounter Medications as of 05/04/2021  Medication Sig  . glipiZIDE (GLUCOTROL XL) 5 MG 24 hr tablet Take 1 tablet (5 mg total) by mouth daily with breakfast.  . glucose blood (ACCU-CHEK GUIDE) test  strip Use as instructed to monitor glucose 4 times daily  . metFORMIN (GLUCOPHAGE) 500 MG tablet Take 1,000 mg by mouth 2 (two) times daily.  . ONGLYZA 5 MG TABS tablet Take 5 mg by mouth every evening.  Marland Kitchen oxyCODONE-acetaminophen (PERCOCET) 5-325 MG tablet Take 1 tablet by mouth every 4 (four) hours as needed. (Patient not taking: Reported on 05/04/2021)  . [DISCONTINUED] empagliflozin (JARDIANCE) 25 MG TABS tablet Take 25 mg by mouth every evening. Started 04/21/2021   No facility-administered encounter medications on file as of 05/04/2021.    ALLERGIES: Allergies  Allergen Reactions  . Penicillins     Did it involve swelling of the face/tongue/throat, SOB, or low BP? Unknown Did it involve sudden or severe rash/hives, skin peeling, or any reaction on the inside of your mouth or nose? Unknown Did you need to seek medical attention at a hospital or doctor's office? Unknown When did it last happen? 45 year old If all above answers are "NO", may proceed with cephalosporin use.   CHILDHOOD  ALLERGY    VACCINATION STATUS:  There is no immunization history on file for this patient.  Diabetes He presents for his initial diabetic visit. He has type 2 diabetes mellitus. Onset time: diagnosed at approx age of 67. His disease course has been fluctuating. There are no hypoglycemic associated symptoms. Associated symptoms include fatigue, polydipsia, polyuria and weight loss. There are no hypoglycemic complications. Symptoms are stable. 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 Current diabetic treatment includes oral agent (triple therapy) (Metformin, Onglyza, Jardiance). He is compliant with treatment most of the time. His weight is decreasing steadily (since starting Jardiance). He is following a generally unhealthy diet. When asked about meal planning, he reported  none. He has not had a previous visit with a dietitian. He rarely participates in exercise. (He presents today for his consultation with no meter or logs to review.  His most recent A1c was 9.8% on 4/22.  He does not routinely monitor glucose at home as he does not like the sight of blood.  He admits to eating a diet higher in carbs as he does not care for fruits or vegetables.  He drinks mostly water (some flavored water as well) and only eats maybe 2 meals per day so he can take his medications.  He does not have much of an appetite.  He denies any s/s of hypoglycemia.  He is due for eye exam.  He has follow up with Dr. Lajoyce Corners tomorrow regarding his great toe amputation.) An ACE inhibitor/angiotensin II receptor blocker is not being taken. He does not see a podiatrist.Eye exam is not current.     Review of systems  Constitutional: + steadily decreasing body weight,  current Body mass index is 30.48 kg/m. , + fatigue, no subjective hyperthermia, no subjective hypothermia Eyes: + blurry vision, no xerophthalmia ENT: no sore throat, no nodules palpated in throat, no dysphagia/odynophagia, no hoarseness Cardiovascular: no chest pain, no shortness of breath, no palpitations, no leg swelling Respiratory: no cough, no shortness of breath Gastrointestinal: no nausea/vomiting/diarrhea Musculoskeletal: no muscle/joint aches, c/o foot pain due to recent toe amputation Skin: no rashes, no hyperemia, surgical incision to great toe from recent amputation- covered in compressive dressing Neurological: no tremors, no numbness, no tingling, no dizziness Psychiatric: no depression, no anxiety  Objective:     BP (!) 141/92   Pulse 98   Ht 6\' 1"  (1.854 m)   Wt 231 lb (104.8 kg)   BMI 30.48 kg/m   Wt Readings from Last 3 Encounters:  05/04/21 231 lb (104.8 kg)  04/29/21 238 lb (108 kg)  12/01/19 260 lb (117.9 kg)     BP Readings from Last 3 Encounters:  05/04/21 (!) 141/92  04/29/21 130/77  04/24/21  137/88     Physical Exam- Limited  Constitutional:  Body mass index is 30.48 kg/m. , not in acute distress, normal state of mind Eyes:  EOMI, no exophthalmos Neck: Supple Cardiovascular: RRR, no murmers, rubs, or gallops, no edema Respiratory: Adequate breathing efforts, no crackles, rales, rhonchi, or wheezing Musculoskeletal: in post op shoe from recent toe amputation, strength intact in all four extremities, no gross restriction of joint movements Skin:  no rashes, no hyperemia Neurological: no tremor with outstretched hands    CMP ( most recent) CMP     Component Value Date/Time   NA 131 (L) 04/29/2021 0756   K 4.7 04/29/2021 0756  CL 101 04/29/2021 0756   CO2 19 (L) 04/29/2021 0756   GLUCOSE 286 (H) 04/29/2021 0756   BUN 12 04/29/2021 0756   CREATININE 0.93 04/29/2021 0756   CALCIUM 8.7 (L) 04/29/2021 0756   PROT 8.4 (H) 12/01/2019 1717   ALBUMIN 3.8 12/01/2019 1717   AST 38 12/01/2019 1717   ALT 49 (H) 12/01/2019 1717   ALKPHOS 66 12/01/2019 1717   BILITOT 1.6 (H) 12/01/2019 1717   GFRNONAA >60 04/29/2021 0756   GFRAA >60 12/01/2019 1717     Diabetic Labs (most recent): No results found for: HGBA1C   Lipid Panel ( most recent) Lipid Panel  No results found for: CHOL, TRIG, HDL, CHOLHDL, VLDL, LDLCALC, LDLDIRECT, LABVLDL    No results found for: TSH, FREET4         Assessment & Plan:   1) Type 2 diabetes mellitus with hyperglycemia, without long-term current use of insulin (HCC)  He presents today for his consultation with no meter or logs to review.  His most recent A1c was 9.8% on 4/22.  He does not routinely monitor glucose at home as he does not like the sight of blood.  He admits to eating a diet higher in carbs as he does not care for fruits or vegetables.  He drinks mostly water (some flavored water as well) and only eats maybe 2 meals per day so he can take his medications.  He does not have much of an appetite.  He denies any s/s of  hypoglycemia.  He is due for eye exam.  He has follow up with Dr. Lajoyce Corners tomorrow regarding his great toe amputation.  - John Church has currently uncontrolled symptomatic type 2 DM since 45 years of age, with most recent A1c of 9.8 %.   -Recent labs reviewed.  - I had a long discussion with him about the progressive nature of diabetes and the pathology behind its complications. -his diabetes is not currently complicated but he remains at a high risk for more acute and chronic complications which include CAD, CVA, CKD, retinopathy, and neuropathy. These are all discussed in detail with him.  - I have counseled him on diet  and weight management by adopting a carbohydrate restricted/protein rich diet. Patient is encouraged to switch to unprocessed or minimally processed complex starch and increased protein intake (animal or plant source), fruits, and vegetables. -  he is advised to stick to a routine mealtimes to eat 3 meals a day and avoid unnecessary snacks (to snack only to correct hypoglycemia).   - he acknowledges that there is a room for improvement in his food and drink choices. - Suggestion is made for him to avoid simple carbohydrates from his diet including Cakes, Sweet Desserts, Ice Cream, Soda (diet and regular), Sweet Tea, Candies, Chips, Cookies, Store Bought Juices, Alcohol in Excess of  1-2 drinks a day, Artificial Sweeteners, Coffee Creamer, and "Sugar-free" Products. This will help patient to have more stable blood glucose profile and potentially avoid unintended weight gain.  - he will be scheduled with Norm Salt, RDN, CDE for diabetes education.  - I have approached him with the following individualized plan to manage  his diabetes and patient agrees:    -he is encouraged to start monitoring glucose 4 times daily, before meals and before bed, to log their readings on the clinic sheets provided, and bring them to review at follow up appointment in 2 weeks.  Sample meter  provided from office today.  - Adjustment  parameters are given to him for hypo and hyperglycemia in writing. - he is encouraged to call clinic for blood glucose levels less than 70 or above 300 mg /dl. - he is advised to continue Metformin 1000 mg po twice daily and Onglyza 5 mg po daily, therapeutically suitable for patient . - his [place] PepperJardiance will be discontinued, risk outweighs benefit for this patient.  - I also discussed and initiated low dose Glipizide 5 mg XL daily with breakfast.  - Specific targets for  A1c;  LDL, HDL,  and Triglycerides were discussed with the patient.  2) Blood Pressure /Hypertension:  his blood pressure is not controlled to target.  He is not currently on any antihypertensive medications at this time. He will be considered for low dose ACE/ARB on subsequent visits if BP remains elevated.    3) Lipids/Hyperlipidemia:    Review of his recent lipid panel from 04/15/21 showed controlled LDL at 123 and elevated triglycerides of 168. He is not currently on any lipid lowering medications at this time.  He will be approached for statin therapy at his follow up visit in 2 weeks.  4)  Weight/Diet:  his Body mass index is 30.48 kg/m.  -    he is a candidate for some weight loss. I discussed with him the fact that loss of 5 - 10% of his  current body weight will have the most impact on his diabetes management.  Exercise, and detailed carbohydrates information provided  -  detailed on discharge instructions.  5) Chronic Care/Health Maintenance: -he is not on ACEI/ARB or Statin medications and is encouraged to initiate and continue to follow up with Ophthalmology, Dentist, Podiatrist at least yearly or according to recommendations, and advised to stay away from smoking. I have recommended yearly flu vaccine and pneumonia vaccine at least every 5 years; moderate intensity exercise for up to 150 minutes weekly; and sleep for at least 7 hours a day.  - he is advised to maintain close  follow up with Lovey NewcomerBoyd, William S, PA for primary care needs, as well as his other providers for optimal and coordinated care.   - Time spent in this patient care: 60 min, of which > 50% was spent in counseling him about his diabetes and the rest reviewing his blood glucose logs, discussing his hypoglycemia and hyperglycemia episodes, reviewing his current and previous labs/studies (including abstraction from other facilities) and medications doses and developing a long term treatment plan based on the latest standards of care/guidelines; and documenting his care.    Please refer to Patient Instructions for Blood Glucose Monitoring and Insulin/Medications Dosing Guide" in media tab for additional information. Please also refer to "Patient Self Inventory" in the Media tab for reviewed elements of pertinent patient history.  John LamyJason S Puello participated in the discussions, expressed understanding, and voiced agreement with the above plans.  All questions were answered to his satisfaction. he is encouraged to contact clinic should he have any questions or concerns prior to his return visit.   Follow up plan: - Return in about 2 weeks (around 05/18/2021) for Diabetes F/U, Bring meter and logs.  Ronny BaconWhitney Wyat Infinger, Milan General HospitalFNP-BC [facility] [address] [place], [place] [address] Phone: [phone] Fax: [phone]  05/04/2021, 12:32 PM

## 2021-05-04 NOTE — Patient Instructions (Signed)
Diabetes Mellitus and Nutrition, Adult When you have diabetes, or diabetes mellitus, it is very important to have healthy eating habits because your blood sugar (glucose) levels are greatly affected by what you eat and drink. Eating healthy foods in the right amounts, at about the same times every day, can help you:  Control your blood glucose.  Lower your risk of heart disease.  Improve your blood pressure.  Reach or maintain a healthy weight. What can affect my meal plan? Every person with diabetes is different, and each person has different needs for a meal plan. Your health care provider may recommend that you work with a dietitian to make a meal plan that is best for you. Your meal plan may vary depending on factors such as:  The calories you need.  The medicines you take.  Your weight.  Your blood glucose, blood pressure, and cholesterol levels.  Your activity level.  Other health conditions you have, such as heart or kidney disease. How do carbohydrates affect me? Carbohydrates, also called carbs, affect your blood glucose level more than any other type of food. Eating carbs naturally raises the amount of glucose in your blood. Carb counting is a method for keeping track of how many carbs you eat. Counting carbs is important to keep your blood glucose at a healthy level, especially if you use insulin or take certain oral diabetes medicines. It is important to know how many carbs you can safely have in each meal. This is different for every person. Your dietitian can help you calculate how many carbs you should have at each meal and for each snack. How does alcohol affect me? Alcohol can cause a sudden decrease in blood glucose (hypoglycemia), especially if you use insulin or take certain oral diabetes medicines. Hypoglycemia can be a life-threatening condition. Symptoms of hypoglycemia, such as sleepiness, dizziness, and confusion, are similar to symptoms of having too much  alcohol.  Do not drink alcohol if: ? Your health care provider tells you not to drink. ? You are pregnant, may be pregnant, or are planning to become pregnant.  If you drink alcohol: ? Do not drink on an empty stomach. ? Limit how much you use to:  0-1 drink a day for women.  0-2 drinks a day for men. ? Be aware of how much alcohol is in your drink. In the [place] one drink equals one 12 oz bottle of beer (355 mL), one 5 oz glass of wine (148 mL), or one 1 oz glass of hard liquor (44 mL). ? Keep yourself hydrated with water, diet soda, or unsweetened iced tea.  Keep in mind that regular soda, juice, and other mixers may contain a lot of sugar and must be counted as carbs. What are tips for following this plan? Reading food labels  Start by checking the serving size on the "Nutrition Facts" label of packaged foods and drinks. The amount of calories, carbs, fats, and other nutrients listed on the label is based on one serving of the item. Many items contain more than one serving per package.  Check the total grams (g) of carbs in one serving. You can calculate the number of servings of carbs in one serving by dividing the total carbs by 15. For example, if a food has 30 g of total carbs per serving, it would be equal to 2 servings of carbs.  Check the number of grams (g) of saturated fats and trans fats in one serving. Choose foods that have   a low amount or none of these fats.  Check the number of milligrams (mg) of salt (sodium) in one serving. Most people should limit total sodium intake to less than 2,300 mg per day.  Always check the nutrition information of foods labeled as "low-fat" or "nonfat." These foods may be higher in added sugar or refined carbs and should be avoided.  Talk to your dietitian to identify your daily goals for nutrients listed on the label. Shopping  Avoid buying canned, pre-made, or processed foods. These foods tend to be high in fat, sodium, and added  sugar.  Shop around the outside edge of the grocery store. This is where you will most often find fresh fruits and vegetables, bulk grains, fresh meats, and fresh dairy. Cooking  Use low-heat cooking methods, such as baking, instead of high-heat cooking methods like deep frying.  Cook using healthy oils, such as olive, canola, or sunflower oil.  Avoid cooking with butter, cream, or high-fat meats. Meal planning  Eat meals and snacks regularly, preferably at the same times every day. Avoid going long periods of time without eating.  Eat foods that are high in fiber, such as fresh fruits, vegetables, beans, and whole grains. Talk with your dietitian about how many servings of carbs you can eat at each meal.  Eat 4-6 oz (112-168 g) of lean protein each day, such as lean meat, chicken, fish, eggs, or tofu. One ounce (oz) of lean protein is equal to: ? 1 oz (28 g) of meat, chicken, or fish. ? 1 egg. ?  cup (62 g) of tofu.  Eat some foods each day that contain healthy fats, such as avocado, nuts, seeds, and fish.   What foods should I eat? Fruits Berries. Apples. Oranges. Peaches. Apricots. Plums. Grapes. Mango. Papaya. Pomegranate. Kiwi. Cherries. Vegetables Lettuce. Spinach. Leafy greens, including kale, chard, collard greens, and mustard greens. Beets. Cauliflower. Cabbage. Broccoli. Carrots. Green beans. Tomatoes. Peppers. Onions. Cucumbers. Brussels sprouts. Grains Whole grains, such as whole-wheat or whole-grain bread, crackers, tortillas, cereal, and pasta. Unsweetened oatmeal. Quinoa. Brown or wild rice. Meats and other proteins Seafood. Poultry without skin. Lean cuts of poultry and beef. Tofu. Nuts. Seeds. Dairy Low-fat or fat-free dairy products such as milk, yogurt, and cheese. The items listed above may not be a complete list of foods and beverages you can eat. Contact a dietitian for more information. What foods should I avoid? Fruits Fruits canned with  syrup. Vegetables Canned vegetables. Frozen vegetables with butter or cream sauce. Grains Refined white flour and flour products such as bread, pasta, snack foods, and cereals. Avoid all processed foods. Meats and other proteins Fatty cuts of meat. Poultry with skin. Breaded or fried meats. Processed meat. Avoid saturated fats. Dairy Full-fat yogurt, cheese, or milk. Beverages Sweetened drinks, such as soda or iced tea. The items listed above may not be a complete list of foods and beverages you should avoid. Contact a dietitian for more information. Questions to ask a health care provider  Do I need to meet with a diabetes educator?  Do I need to meet with a dietitian?  What number can I call if I have questions?  When are the best times to check my blood glucose? Where to find more information:  American Diabetes Association: diabetes.org  Academy of Nutrition and Dietetics: www.eatright.org  National Institute of Diabetes and Digestive and Kidney Diseases: www.niddk.nih.gov  Association of Diabetes Care and Education Specialists: www.diabeteseducator.org Summary  It is important to have healthy eating   habits because your blood sugar (glucose) levels are greatly affected by what you eat and drink.  A healthy meal plan will help you control your blood glucose and maintain a healthy lifestyle.  Your health care provider may recommend that you work with a dietitian to make a meal plan that is best for you.  Keep in mind that carbohydrates (carbs) and alcohol have immediate effects on your blood glucose levels. It is important to count carbs and to use alcohol carefully. This information is not intended to replace advice given to you by your health care provider. Make sure you discuss any questions you have with your health care provider. Document Revised: 11/18/2019 Document Reviewed: 11/18/2019 Elsevier Patient Education  2021 [employer].  

## 2021-05-05 ENCOUNTER — Encounter: Payer: Self-pay | Admitting: Orthopedic Surgery

## 2021-05-05 ENCOUNTER — Ambulatory Visit (INDEPENDENT_AMBULATORY_CARE_PROVIDER_SITE_OTHER): Payer: 59 | Admitting: Physician Assistant

## 2021-05-05 DIAGNOSIS — M869 Osteomyelitis, unspecified: Secondary | ICD-10-CM

## 2021-05-05 NOTE — Progress Notes (Signed)
Office Visit Note   Patient: John Church           Date of Birth: [birth year]           MRN: [id number] Visit Date: 05/05/2021              Requested by: Practice, [facility] [address] [place],  [place] [address] PCP: Lovey Newcomer, [place]  Chief Complaint  Patient presents with  . Left Foot - Routine Post Op    04/29/21 left GT amputation       HPI: Patient presents today 1 week status post left great toe amputation he is doing well without complaints  Assessment & Plan: Visit Diagnoses: No diagnosis found.  Plan: Follow-up in 1 week could possibly have sutures removed at that visit we will give him a prescription for a filler carbon plate for his work shoes  Follow-Up Instructions: No follow-ups on file.   Ortho Exam  Patient is alert, oriented, no adenopathy, well-dressed, normal affect, normal respiratory effort. Examination demonstrates well approximated wound edges sutures are in place swelling is well controlled no drainage no wound dehiscence no ascending cellulitis  Imaging: No results found. No images are attached to the encounter.  Labs: Lab Results  Component Value Date   REPTSTATUS 02/19/2012 FINAL 02/17/2012   GRAMSTAIN  02/17/2012    FEW WBC PRESENT, PREDOMINANTLY PMN NO SQUAMOUS EPITHELIAL CELLS SEEN MODERATE GRAM POSITIVE COCCI IN PAIRS FEW GRAM NEGATIVE RODS   CULT  02/17/2012    ABUNDANT GROUP B STREP(S.AGALACTIAE)ISOLATED Note: TESTING AGAINST S. AGALACTIAE NOT ROUTINELY PERFORMED DUE TO PREDICTABILITY OF AMP/PEN/VAN SUSCEPTIBILITY.     Lab Results  Component Value Date   ALBUMIN 3.8 12/01/2019   ALBUMIN 3.7 06/23/2009    No results found for: MG No results found for: VD25OH  No results found for: PREALBUMIN CBC EXTENDED Latest Ref Rng & Units 04/24/2021 12/01/2019 06/23/2009  WBC 3.4 - 10.8 x10E3/uL 19.3(H) 4.6 13.4(H)  RBC 4.14 - 5.80 x10E6/uL 5.48 6.00(H) 4.97  HGB 13.0 - 17.7 g/dL 94.7 17.2(H) 15.1  HCT 37.5 - 51.0 % 47.3 50.2  42.8  PLT 150 - 450 x10E3/uL 358 130(L) 239  NEUTROABS 1.4 - 7.0 x10E3/uL 15.6(H) - 12.1(H)  LYMPHSABS 0.7 - 3.1 x10E3/uL 2.1 - 0.9     There is no height or weight on file to calculate BMI.  Orders:  No orders of the defined types were placed in this encounter.  No orders of the defined types were placed in this encounter.    Procedures: No procedures performed  Clinical Data: No additional findings.  ROS:  All other systems negative, except as noted in the HPI. Review of Systems  Objective: Vital Signs: There were no vitals taken for this visit.  Specialty Comments:  No specialty comments available.  PMFS History: Patient Active Problem List   Diagnosis Date Noted  . Osteomyelitis of great toe of left foot [facility])    Past Medical History:  Diagnosis Date  . COVID-19 2020  . Diabetes mellitus without complication (HCC)    type 2  . Dyspnea    wtih exertion  . History of kidney stones   . Peripheral neuropathy     Family History  Problem Relation Age of Onset  . Diabetes Mother   . Diabetes Father   . Diabetes Brother     Past Surgical History:  Procedure Laterality Date  . AMPUTATION Left 04/29/2021   Procedure: LEFT GREAT TOE AMPUTATION;  Surgeon:  [name], MD;  Location: [facility] OR;  Service: Orthopedics;  Laterality: Left;  Marland Kitchen MULTIPLE TOOTH EXTRACTIONS     Social History   Occupational History  . Not on file  Tobacco Use  . Smoking status: Never Smoker  . Smokeless tobacco: Never Used  Vaping Use  . Vaping Use: Never used  Substance and Sexual Activity  . Alcohol use: Not Currently  . Drug use: Never  . Sexual activity: Not on file

## 2021-05-13 ENCOUNTER — Ambulatory Visit (INDEPENDENT_AMBULATORY_CARE_PROVIDER_SITE_OTHER): Payer: 59 | Admitting: Physician Assistant

## 2021-05-13 ENCOUNTER — Encounter: Payer: Self-pay | Admitting: Physician Assistant

## 2021-05-13 DIAGNOSIS — M79672 Pain in left foot: Secondary | ICD-10-CM

## 2021-05-13 NOTE — Progress Notes (Signed)
Office Visit Note   Patient: John Church           Date of Birth: 11-Jan-1976           MRN: [id number] Visit Date: 05/13/2021              Requested by: Lovey Newcomer, PA [address] [place],  [place] [address] PCP: Lovey Newcomer, [place]  Chief Complaint  Patient presents with  . Left Foot - Routine Post Op    04/29/21 left GT amputation       HPI: Patient is 2 weeks status post great toe amputation.  He has been doing well and compliant with elevating his foot as requested.  He is also been trying to control his blood sugars  Assessment & Plan: Visit Diagnoses: No diagnosis found.  Plan: We will follow-up in 2 weeks for reevaluation should continue to use the postop shoe.  If he is doing well at the next visit I think he could be released to work in  Follow-Up Instructions: No follow-ups on file.   Ortho Exam  Patient is alert, oriented, no adenopathy, well-dressed, normal affect, normal respiratory effort. Incision is well-healed well apposed and healed wound edges.  No drainage no erythema no cellulitis sutures were harvested.  He did have a slight dehiscence of the skin this did not go deeply.  1 Steri-Strip was applied loosely just to take pressure off the wound edges  Imaging: No results found. No images are attached to the encounter.  Labs: Lab Results  Component Value Date   REPTSTATUS 02/19/2012 FINAL 02/17/2012   GRAMSTAIN  02/17/2012    FEW WBC PRESENT, PREDOMINANTLY PMN NO SQUAMOUS EPITHELIAL CELLS SEEN MODERATE GRAM POSITIVE COCCI IN PAIRS FEW GRAM NEGATIVE RODS   CULT  02/17/2012    ABUNDANT GROUP B STREP(S.AGALACTIAE)ISOLATED Note: TESTING AGAINST S. AGALACTIAE NOT ROUTINELY PERFORMED DUE TO PREDICTABILITY OF AMP/PEN/VAN SUSCEPTIBILITY.     Lab Results  Component Value Date   ALBUMIN 3.8 12/01/2019   ALBUMIN 3.7 06/23/2009    No results found for: MG No results found for: VD25OH  No results found for: PREALBUMIN CBC EXTENDED Latest Ref  Rng & Units 04/24/2021 12/01/2019 06/23/2009  WBC 3.4 - 10.8 x10E3/uL 19.3(H) 4.6 13.4(H)  RBC 4.14 - 5.80 x10E6/uL 5.48 6.00(H) 4.97  HGB 13.0 - 17.7 g/dL 70.6 17.2(H) 15.1  HCT 37.5 - 51.0 % 47.3 50.2 42.8  PLT 150 - 450 x10E3/uL 358 130(L) 239  NEUTROABS 1.4 - 7.0 x10E3/uL 15.6(H) - 12.1(H)  LYMPHSABS 0.7 - 3.1 x10E3/uL 2.1 - 0.9     There is no height or weight on file to calculate BMI.  Orders:  No orders of the defined types were placed in this encounter.  No orders of the defined types were placed in this encounter.    Procedures: No procedures performed  Clinical Data: No additional findings.  ROS:  All other systems negative, except as noted in the HPI. Review of Systems  Objective: Vital Signs: There were no vitals taken for this visit.  Specialty Comments:  No specialty comments available.  PMFS History: Patient Active Problem List   Diagnosis Date Noted  . Osteomyelitis of great toe of left foot [facility])    Past Medical History:  Diagnosis Date  . COVID-19 2020  . Diabetes mellitus without complication (HCC)    type 2  . Dyspnea    wtih exertion  . History of kidney stones   . Peripheral neuropathy  Family History  Problem Relation Age of Onset  . Diabetes Mother   . Diabetes Father   . Diabetes Brother     Past Surgical History:  Procedure Laterality Date  . AMPUTATION Left 04/29/2021   Procedure: LEFT GREAT TOE AMPUTATION;  Surgeon: [name], MD;  Location: [facility] OR;  Service: Orthopedics;  Laterality: Left;  Marland Kitchen MULTIPLE TOOTH EXTRACTIONS     Social History   Occupational History  . Not on file  Tobacco Use  . Smoking status: Never Smoker  . Smokeless tobacco: Never Used  Vaping Use  . Vaping Use: Never used  Substance and Sexual Activity  . Alcohol use: Not Currently  . Drug use: Never  . Sexual activity: Not on file

## 2021-05-19 ENCOUNTER — Other Ambulatory Visit: Payer: Self-pay

## 2021-05-19 ENCOUNTER — Encounter: Payer: Self-pay | Admitting: Nurse Practitioner

## 2021-05-19 ENCOUNTER — Encounter: Payer: 59 | Admitting: Nutrition

## 2021-05-19 ENCOUNTER — Ambulatory Visit: Payer: 59 | Admitting: Nurse Practitioner

## 2021-05-19 VITALS — BP 123/85 | HR 89 | Ht 73.0 in | Wt 234.4 lb

## 2021-05-19 DIAGNOSIS — E1165 Type 2 diabetes mellitus with hyperglycemia: Secondary | ICD-10-CM

## 2021-05-19 DIAGNOSIS — I10 Essential (primary) hypertension: Secondary | ICD-10-CM

## 2021-05-19 DIAGNOSIS — E782 Mixed hyperlipidemia: Secondary | ICD-10-CM

## 2021-05-19 MED ORDER — ONGLYZA 5 MG PO TABS: 5.0000 mg | ORAL_TABLET | Freq: Every evening | ORAL | 3 refills | Status: DC

## 2021-05-19 NOTE — Progress Notes (Signed)
Endocrinology Follow Up Note       05/19/2021, 10:40 AM   Subjective:    Patient ID: John LamyJason S Staton, male    DOB: 03-Oct-1976.  John LamyJason S Ullmer is being seen in follow up after being seen in consultation for management of currently uncontrolled symptomatic diabetes requested by  Lovey NewcomerBoyd, William S, PA.   Past Medical History:  Diagnosis Date  . COVID-19 2020  . Diabetes mellitus without complication (HCC)    type 2  . Dyspnea    wtih exertion  . History of kidney stones   . Peripheral neuropathy     Past Surgical History:  Procedure Laterality Date  . AMPUTATION Left 04/29/2021   Procedure: LEFT GREAT TOE AMPUTATION;  Surgeon: Nadara Mustarduda, Marcus V, MD;  [facility];  Service: Orthopedics;  Laterality: Left;  Marland Kitchen. MULTIPLE TOOTH EXTRACTIONS      Social History   Socioeconomic History  . Marital status: Single    Spouse name: Not on file  . Number of children: Not on file  . Years of education: Not on file  . Highest education level: Not on file  Occupational History  . Not on file  Tobacco Use  . Smoking status: Never Smoker  . Smokeless tobacco: Never Used  Vaping Use  . Vaping Use: Never used  Substance and Sexual Activity  . Alcohol use: Not Currently  . Drug use: Never  . Sexual activity: Not on file  Other Topics Concern  . Not on file  Social History Narrative  . Not on file   Social Determinants of Health   Financial Resource Strain: Not on file  Food Insecurity: Not on file  Transportation Needs: Not on file  Physical Activity: Not on file  Stress: Not on file  Social Connections: Not on file    Family History  Problem Relation Age of Onset  . Diabetes Mother   . Diabetes Father   . Diabetes Brother     Outpatient Encounter Medications as of 05/19/2021  Medication Sig  . glipiZIDE (GLUCOTROL XL) 5 MG 24 hr tablet Take 1 tablet (5 mg total) by mouth daily with breakfast.  . glucose  blood (ACCU-CHEK GUIDE) test strip Use as instructed to monitor glucose 4 times daily  . metFORMIN (GLUCOPHAGE) 500 MG tablet Take 1,000 mg by mouth 2 (two) times daily.  . ONGLYZA 5 MG TABS tablet Take 1 tablet (5 mg total) by mouth every evening.  Marland Kitchen. oxyCODONE-acetaminophen (PERCOCET) 5-325 MG tablet Take 1 tablet by mouth every 4 (four) hours as needed. (Patient not taking: Reported on 05/04/2021)  . [DISCONTINUED] ONGLYZA 5 MG TABS tablet Take 5 mg by mouth every evening.   No facility-administered encounter medications on file as of 05/19/2021.    ALLERGIES: Allergies  Allergen Reactions  . Penicillins     Did it involve swelling of the face/tongue/throat, SOB, or low BP? Unknown Did it involve sudden or severe rash/hives, skin peeling, or any reaction on the inside of your mouth or nose? Unknown Did you need to seek medical attention at a hospital or doctor's office? Unknown When did it last happen? 45 year old If all above answers are "NO", may  proceed with cephalosporin use.   CHILDHOOD ALLERGY    VACCINATION STATUS:  There is no immunization history on file for this patient.  Diabetes He presents for his follow-up diabetic visit. He has type 2 diabetes mellitus. Onset time: diagnosed at approx age of 74. His disease course has been fluctuating. There are no hypoglycemic associated symptoms. Associated symptoms include fatigue. Pertinent negatives for diabetes include no polydipsia, no polyuria and no weight loss. There are no hypoglycemic complications. Symptoms are stable. 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 Current diabetic treatment includes oral agent (triple therapy) (Metformin, Onglyza). He is compliant with treatment most of the time. His weight is fluctuating minimally. He is following a generally healthy diet. Meal planning includes  avoidance of concentrated sweets and ADA exchanges. He has not had a previous visit with a dietitian. He rarely participates in exercise. His home blood glucose trend is decreasing steadily. His breakfast blood glucose range is generally 110-130 mg/dl. His lunch blood glucose range is generally 130-140 mg/dl. His dinner blood glucose range is generally 130-140 mg/dl. His bedtime blood glucose range is generally 130-140 mg/dl. (He presents today with his meter and logs showing at target fasting and postprandial glycemic profile.  He says he has been following the diet strictly and overall does not feel good.  He says he has no energy and the foods he is eating is "making him sick."  He has not yet seen Boyd Kerbs, RDE, has appointment next month but she has availability to see him today.  He denies any hypoglycemia.  His foot is improving after his recent amputation.) An ACE inhibitor/angiotensin II receptor blocker is not being taken. He does not see a podiatrist.Eye exam is not current.     Review of systems  Constitutional: + stable body weight,  current Body mass index is 30.93 kg/m. , + fatigue, no subjective hyperthermia, no subjective hypothermia Eyes: + blurry vision, no xerophthalmia ENT: no sore throat, no nodules palpated in throat, no dysphagia/odynophagia, no hoarseness Cardiovascular: no chest pain, no shortness of breath, no palpitations, no leg swelling Respiratory: no cough, no shortness of breath Gastrointestinal: no nausea/vomiting/diarrhea Musculoskeletal: no muscle/joint aches Skin: no rashes, no hyperemia, surgical incision to great toe from recent amputation- covered in compressive dressing and post-op shoe Neurological: no tremors, no numbness, no tingling, no dizziness Psychiatric: no depression, no anxiety  Objective:     BP 123/85   Pulse 89   Ht 6\' 1"  (1.854 m)   Wt 234 lb 6.4 oz (106.3 kg)   BMI 30.93 kg/m   Wt Readings from Last 3 Encounters:  05/19/21 234 lb 6.4  oz (106.3 kg)  05/04/21 231 lb (104.8 kg)  04/29/21 238 lb (108 kg)     BP Readings from Last 3 Encounters:  05/19/21 123/85  05/04/21 (!) 141/92  04/29/21 130/77     Physical Exam- Limited  Constitutional:  Body mass index is 30.93 kg/m. , not in acute distress, normal state of mind Eyes:  EOMI, no exophthalmos Neck: Supple Cardiovascular: RRR, no murmurs, rubs, or gallops, no edema Respiratory: Adequate breathing efforts, no crackles, rales, rhonchi, or wheezing Musculoskeletal: in post op shoe from recent toe amputation, strength intact in all four extremities, no gross restriction of joint movements Skin:  no rashes, no hyperemia Neurological: no tremor with outstretched hands    CMP ( most recent) CMP     Component  Value Date/Time   NA 131 (L) 04/29/2021 0756   K 4.7 04/29/2021 0756   CL 101 04/29/2021 0756   CO2 19 (L) 04/29/2021 0756   GLUCOSE 286 (H) 04/29/2021 0756   BUN 12 04/29/2021 0756   CREATININE 0.93 04/29/2021 0756   CALCIUM 8.7 (L) 04/29/2021 0756   PROT 8.4 (H) 12/01/2019 1717   ALBUMIN 3.8 12/01/2019 1717   AST 38 12/01/2019 1717   ALT 49 (H) 12/01/2019 1717   ALKPHOS 66 12/01/2019 1717   BILITOT 1.6 (H) 12/01/2019 1717   GFRNONAA >60 04/29/2021 0756   GFRAA >60 12/01/2019 1717     Diabetic Labs (most recent): No results found for: HGBA1C   Lipid Panel ( most recent) Lipid Panel  No results found for: CHOL, TRIG, HDL, CHOLHDL, VLDL, LDLCALC, LDLDIRECT, LABVLDL    No results found for: TSH, FREET4         Assessment & Plan:   1) Type 2 diabetes mellitus with hyperglycemia, without long-term current use of insulin (HCC)  He presents today with his meter and logs showing at target fasting and postprandial glycemic profile.  He says he has been following the diet strictly and overall does not feel good.  He says he has no energy and the foods he is eating is "making him sick."  He has not yet seen Boyd Kerbs, RDE, has appointment next month  but she has availability to see him today.  He denies any hypoglycemia.  His foot is improving after his recent amputation.  - HAARIS METALLO has currently uncontrolled symptomatic type 2 DM since 45 years of age, with most recent A1c of 9.8 %.   -Recent labs reviewed.  - I had a long discussion with him about the progressive nature of diabetes and the pathology behind its complications. -his diabetes is not currently complicated but he remains at a high risk for more acute and chronic complications which include CAD, CVA, CKD, retinopathy, and neuropathy. These are all discussed in detail with him.  - Nutritional counseling repeated at each appointment due to patients tendency to fall back in to old habits.  - The patient admits there is a room for improvement in their diet and drink choices. -  Suggestion is made for the patient to avoid simple carbohydrates from their diet including Cakes, Sweet Desserts / Pastries, Ice Cream, Soda (diet and regular), Sweet Tea, Candies, Chips, Cookies, Sweet Pastries, Store Bought Juices, Alcohol in Excess of 1-2 drinks a day, Artificial Sweeteners, Coffee Creamer, and "Sugar-free" Products. This will help patient to have stable blood glucose profile and potentially avoid unintended weight gain.   - I encouraged the patient to switch to unprocessed or minimally processed complex starch and increased protein intake (animal or plant source), fruits, and vegetables.   - Patient is advised to stick to a routine mealtimes to eat 3 meals a day and avoid unnecessary snacks (to snack only to correct hypoglycemia).  - he will be scheduled with Norm Salt, RDN, CDE for diabetes education.  - I have approached him with the following individualized plan to manage  his diabetes and patient agrees:   -Based on his improved glycemic profile, he is advised to continue current regimen of Metformin 1000 mg po twice daily with meals, Onglyza 5 mg po daily, and Glipizide 5  mg XL daily with breakfast.    -He is encouraged to continue monitoring blood glucose 1-2 times daily, before breakfast and before bed, and to call the clinic  if he has readings less than 70 or greater than 300 for 3 tests in a row.  - Adjustment parameters are given to him for hypo and hyperglycemia in writing.  - Specific targets for  A1c;  LDL, HDL,  and Triglycerides were discussed with the patient.  2) Blood Pressure /Hypertension:  his blood pressure is controlled to target.  He is not currently on any antihypertensive medications at this time.  3) Lipids/Hyperlipidemia:    Review of his recent lipid panel from 04/15/21 showed controlled LDL at 123 and elevated triglycerides of 168. He is not currently on any lipid lowering medications at this time. This will be discussed on subsequent visits.  4)  Weight/Diet:  his Body mass index is 30.93 kg/m.  -    he is a candidate for some weight loss. I discussed with him the fact that loss of 5 - 10% of his  current body weight will have the most impact on his diabetes management.  Exercise, and detailed carbohydrates information provided  -  detailed on discharge instructions.  5) Chronic Care/Health Maintenance: -he is not on ACEI/ARB or Statin medications and is encouraged to initiate and continue to follow up with Ophthalmology, Dentist, Podiatrist at least yearly or according to recommendations, and advised to stay away from smoking. I have recommended yearly flu vaccine and pneumonia vaccine at least every 5 years; moderate intensity exercise for up to 150 minutes weekly; and sleep for at least 7 hours a day.  - he is advised to maintain close follow up with Lovey Newcomer, PA for primary care needs, as well as his other providers for optimal and coordinated care.     I spent 21 minutes in the care of the patient today including review of labs from CMP, Lipids, Thyroid Function, Hematology (current and previous including abstractions  from other facilities); face-to-face time discussing  his blood glucose readings/logs, discussing hypoglycemia and hyperglycemia episodes and symptoms, medications doses, his options of short and long term treatment based on the latest standards of care / guidelines;  discussion about incorporating lifestyle medicine;  and documenting the encounter.    Please refer to Patient Instructions for Blood Glucose Monitoring and Insulin/Medications Dosing Guide"  in media tab for additional information. Please  also refer to " Patient Self Inventory" in the Media  tab for reviewed elements of pertinent patient history.  John Church participated in the discussions, expressed understanding, and voiced agreement with the above plans.  All questions were answered to his satisfaction. he is encouraged to contact clinic should he have any questions or concerns prior to his return visit.  Follow up plan: - Return in about 3 months (around 08/19/2021) for Diabetes F/U- A1c and UM in office, Bring meter and logs, ABI next visit, No previsit labs.  Ronny Bacon, [facility] [address] [place], [place] [address] Phone: [phone] Fax: [phone]  05/19/2021, 10:40 AM

## 2021-05-19 NOTE — Patient Instructions (Signed)

## 2021-05-20 ENCOUNTER — Telehealth: Payer: Self-pay | Admitting: Physician Assistant

## 2021-05-20 NOTE — Telephone Encounter (Signed)
Patient submitted medical release form, Raoul Pitch has short term disability, and pt paid $25.00 money order to [employer]. Accepted 05/20/21

## 2021-05-27 ENCOUNTER — Other Ambulatory Visit: Payer: Self-pay

## 2021-05-27 ENCOUNTER — Encounter: Payer: Self-pay | Admitting: Physician Assistant

## 2021-05-27 ENCOUNTER — Ambulatory Visit: Payer: 59 | Admitting: Physician Assistant

## 2021-05-27 DIAGNOSIS — M869 Osteomyelitis, unspecified: Secondary | ICD-10-CM

## 2021-05-27 NOTE — Progress Notes (Signed)
Office Visit Note   Patient: John Church           Date of Birth: 08-26-1976           MRN: [id number] Visit Date: 05/27/2021              Requested by: Lovey Newcomer, PA [address] [place],  [place] [address] PCP: Lovey Newcomer, [place]  No chief complaint on file.     HPI: This is a pleasant 45 year old gentleman who is 1 month status post great toe amputation.  He is overall doing well.  He describes some occasional phantom pain that he gets often at night.  He is requesting to go to back to work next week.  Assessment & Plan: Visit Diagnoses: No diagnosis found.  Plan: We will release him back to work next week with 8-hour shifts the first week.  He should be mindful of his steel toed shoes and make sure he has a shoe with a tall and wide toe box.  He is to examine his feet daily.  I did offer him orthotics which he declined today.  Follow-Up Instructions: No follow-ups on file.   Ortho Exam  Patient is alert, oriented, no adenopathy, well-dressed, normal affect, normal respiratory effort. Amputation stump is well-healed.  No surrounding erythema no drainage no swelling no ascending cellulitis or signs of infection  Imaging: No results found. No images are attached to the encounter.  Labs: Lab Results  Component Value Date   REPTSTATUS 02/19/2012 FINAL 02/17/2012   GRAMSTAIN  02/17/2012    FEW WBC PRESENT, PREDOMINANTLY PMN NO SQUAMOUS EPITHELIAL CELLS SEEN MODERATE GRAM POSITIVE COCCI IN PAIRS FEW GRAM NEGATIVE RODS   CULT  02/17/2012    ABUNDANT GROUP B STREP(S.AGALACTIAE)ISOLATED Note: TESTING AGAINST S. AGALACTIAE NOT ROUTINELY PERFORMED DUE TO PREDICTABILITY OF AMP/PEN/VAN SUSCEPTIBILITY.     Lab Results  Component Value Date   ALBUMIN 3.8 12/01/2019   ALBUMIN 3.7 06/23/2009    No results found for: MG No results found for: VD25OH  No results found for: PREALBUMIN CBC EXTENDED Latest Ref Rng & Units 04/24/2021 12/01/2019 06/23/2009  WBC 3.4 - 10.8  x10E3/uL 19.3(H) 4.6 13.4(H)  RBC 4.14 - 5.80 x10E6/uL 5.48 6.00(H) 4.97  HGB 13.0 - 17.7 g/dL 19.1 17.2(H) 15.1  HCT 37.5 - 51.0 % 47.3 50.2 42.8  PLT 150 - 450 x10E3/uL 358 130(L) 239  NEUTROABS 1.4 - 7.0 x10E3/uL 15.6(H) - 12.1(H)  LYMPHSABS 0.7 - 3.1 x10E3/uL 2.1 - 0.9     There is no height or weight on file to calculate BMI.  Orders:  No orders of the defined types were placed in this encounter.  No orders of the defined types were placed in this encounter.    Procedures: No procedures performed  Clinical Data: No additional findings.  ROS:  All other systems negative, except as noted in the HPI. Review of Systems  Objective: Vital Signs: There were no vitals taken for this visit.  Specialty Comments:  No specialty comments available.  PMFS History: Patient Active Problem List   Diagnosis Date Noted  . Osteomyelitis of great toe of left foot [facility])    Past Medical History:  Diagnosis Date  . COVID-19 2020  . Diabetes mellitus without complication (HCC)    type 2  . Dyspnea    wtih exertion  . History of kidney stones   . Peripheral neuropathy     Family History  Problem Relation Age of Onset  .  Diabetes Mother   . Diabetes Father   . Diabetes Brother     Past Surgical History:  Procedure Laterality Date  . AMPUTATION Left 04/29/2021   Procedure: LEFT GREAT TOE AMPUTATION;  Surgeon: [name], MD;  Location: [facility] OR;  Service: Orthopedics;  Laterality: Left;  Marland Kitchen MULTIPLE TOOTH EXTRACTIONS     Social History   Occupational History  . Not on file  Tobacco Use  . Smoking status: Never Smoker  . Smokeless tobacco: Never Used  Vaping Use  . Vaping Use: Never used  Substance and Sexual Activity  . Alcohol use: Not Currently  . Drug use: Never  . Sexual activity: Not on file

## 2021-06-10 ENCOUNTER — Telehealth: Payer: Self-pay

## 2021-06-10 MED ORDER — METFORMIN HCL 500 MG PO TABS: 1000.0000 mg | ORAL_TABLET | Freq: Two times a day (BID) | ORAL | 0 refills | Status: DC

## 2021-06-10 NOTE — Telephone Encounter (Signed)
Rx sent 

## 2021-06-10 NOTE — Telephone Encounter (Signed)
Pt requesting refill on metFORMIN (GLUCOPHAGE) 500 MG tablet. [facility] [place] 

## 2021-06-15 ENCOUNTER — Encounter: Payer: Self-pay | Admitting: Nutrition

## 2021-06-15 ENCOUNTER — Other Ambulatory Visit: Payer: Self-pay

## 2021-06-15 ENCOUNTER — Encounter: Payer: 59 | Attending: Nurse Practitioner | Admitting: Nutrition

## 2021-06-15 DIAGNOSIS — E118 Type 2 diabetes mellitus with unspecified complications: Secondary | ICD-10-CM | POA: Diagnosis present

## 2021-06-15 DIAGNOSIS — E1165 Type 2 diabetes mellitus with hyperglycemia: Secondary | ICD-10-CM | POA: Diagnosis present

## 2021-06-15 DIAGNOSIS — IMO0002 Reserved for concepts with insufficient information to code with codable children: Secondary | ICD-10-CM

## 2021-06-15 NOTE — Patient Instructions (Addendum)
Goals  Eat 45-60 g CHO at meals. Talk to MD about going to a podiatrist about swelling in feet Keep gallon of water per day Check with MD about referral to heart doctor Prevent low blood sugars. Keep A1C below 7%

## 2021-06-15 NOTE — Progress Notes (Signed)
Medical Nutrition Therapy  Follow up Appointment Start time:  1330  Appointment End time:  1400  Primary concerns today: Dm Type 2  Referral diagnosis: E11.8 Preferred learning style: read   Learning readiness: ready   Dx 4-5  Yrs, Calla Kicks, PCP Complains on swelling in his feet. Just had a toe removed on his left foot. Got diabetic socks. Has a job standing at work.   NUTRITION ASSESSMENT  FBS: 120-140's Bedtime: 120-150's.  Changes: increased his carb intake recently and he feels better and eating more balanced meals. Drinking water. He notes his feet and ankles are swollen at times.    Anthropometrics    Wt Readings from Last 3 Encounters:  05/19/21 234 lb 6.4 oz (106.3 kg)  05/04/21 231 lb (104.8 kg)  04/29/21 238 lb (108 kg)   Ht Readings from Last 3 Encounters:  05/19/21 6\' 1"  (1.854 m)  05/04/21 6\' 1"  (1.854 m)  04/29/21 6\' 2"  (1.88 m)   There is no height or weight on file to calculate BMI. @BMIFA @ Facility age limit for growth percentiles is 20 years. Facility age limit for growth percentiles is 20 years.  Clinical Medical Hx: Dm Type 2 Medications: Metformin Onglyza and Glipizide Labs: Last A1C 9.8%.  Notable Signs/Symptoms: Numbness in feet and swelling in legs-left foots.  Lifestyle & Dietary Hx LIve with by himself. Cooks meals at home.   Estimated daily fluid intake: 64-84 oz Supplements: none Sleep: 6-8 Stress / self-care: work and his diabetes Current average weekly physical activity: not a lot since losing his toe  24-Hr Dietary Recall First Meal: 3 hard boiled eggs, whole wheat toast dry, OR high protein low carb waffles with pb, water Snack: Second Meal: 2 ww bread, 3 oz of deli, roasted chicken, 2 cups spring mix with 06/29/21 dressing, cracker 4-6 crackers, water Snack:  Third Meal: CHicken, salad, 2 slices ww toast, water Snack:  Beverages: water   Estimated Energy Needs Calories: 1800-2000 Carbohydrate: 225 g Protein:  150 gg Fat: 56g   NUTRITION DIAGNOSIS  NB-1.1 Food and nutrition-related knowledge deficit As related to Diabetes Type 2.  As evidenced by A1C 9.8%.  NUTRITION INTERVENTION  Nutrition education (E-1) on the following topics:  Meal Planning Exercise   Handouts Provided Include  Lifestyle nutrition handout   Learning Style & Readiness for Change Teaching method utilized: Visual & Auditory  Demonstrated degree of understanding via: Teach Back  Barriers to learning/adherence to lifestyle change: none  Goals Established by Pt Keep up the great job!  Eat 45-60 g CHO at meals. Talk to MD about going to a podiatrist about swelling in feet Keep gallon of water per day Check with MD about referral to heart doctor Prevent low blood sugars. Keep A1C below 7%   MONITORING & EVALUATION Dietary intake, weekly physical activity, and blood sugars in 3 months.  Next Steps  Patient is to work on weight loss and improved nutrition.

## 2021-06-16 ENCOUNTER — Ambulatory Visit (INDEPENDENT_AMBULATORY_CARE_PROVIDER_SITE_OTHER): Payer: 59 | Admitting: Orthopedic Surgery

## 2021-06-16 ENCOUNTER — Encounter: Payer: Self-pay | Admitting: Orthopedic Surgery

## 2021-06-16 DIAGNOSIS — I872 Venous insufficiency (chronic) (peripheral): Secondary | ICD-10-CM

## 2021-06-16 NOTE — Progress Notes (Signed)
Office Visit Note   Patient: John Church           Date of Birth: [birth year]           MRN: [id number] Visit Date: 06/16/2021              Requested by: Lovey Newcomer, PA [address] [place],  [place] [address] PCP: Lovey Newcomer, [place]  Chief Complaint  Patient presents with   Left Foot - Follow-up      HPI: Patient is a 45 year old gentleman who presents 8 weeks status post left great toe amputation.  Patient states he has been back to work but has been having increased swelling while being on his feet.  Assessment & Plan: Visit Diagnoses:  1. Venous stasis dermatitis of left lower extremity     Plan: Recommended a size large compression stocking to help with the venous insufficiency.  Follow-Up Instructions: Return if symptoms worsen or fail to improve.   Ortho Exam  Patient is alert, oriented, no adenopathy, well-dressed, normal affect, normal respiratory effort. Examination the patient has a palpable dorsalis pedis and posterior tibial pulse by Doppler he has a strong biphasic Doppler gain pulse.  There is venous swelling in his foot there is no cellulitis no drainage no signs of infection.  Imaging: No results found.   Labs: Lab Results  Component Value Date   REPTSTATUS 02/19/2012 FINAL 02/17/2012   GRAMSTAIN  02/17/2012    FEW WBC PRESENT, PREDOMINANTLY PMN NO SQUAMOUS EPITHELIAL CELLS SEEN MODERATE GRAM POSITIVE COCCI IN PAIRS FEW GRAM NEGATIVE RODS   CULT  02/17/2012    ABUNDANT GROUP B STREP(S.AGALACTIAE)ISOLATED Note: TESTING AGAINST S. AGALACTIAE NOT ROUTINELY PERFORMED DUE TO PREDICTABILITY OF AMP/PEN/VAN SUSCEPTIBILITY.     Lab Results  Component Value Date   ALBUMIN 3.8 12/01/2019   ALBUMIN 3.7 06/23/2009    No results found for: MG No results found for: VD25OH  No results found for: PREALBUMIN CBC EXTENDED Latest Ref Rng & Units 04/24/2021 12/01/2019 06/23/2009  WBC 3.4 - 10.8 x10E3/uL 19.3(H) 4.6 13.4(H)  RBC 4.14 - 5.80 x10E6/uL  5.48 6.00(H) 4.97  HGB 13.0 - 17.7 g/dL 44.0 17.2(H) 15.1  HCT 37.5 - 51.0 % 47.3 50.2 42.8  PLT 150 - 450 x10E3/uL 358 130(L) 239  NEUTROABS 1.4 - 7.0 x10E3/uL 15.6(H) - 12.1(H)  LYMPHSABS 0.7 - 3.1 x10E3/uL 2.1 - 0.9     There is no height or weight on file to calculate BMI.  Orders:  No orders of the defined types were placed in this encounter.  No orders of the defined types were placed in this encounter.    Procedures: No procedures performed  Clinical Data: No additional findings.  ROS:  All other systems negative, except as noted in the HPI. Review of Systems  Objective: Vital Signs: There were no vitals taken for this visit.  Specialty Comments:  No specialty comments available.  PMFS History: Patient Active Problem List   Diagnosis Date Noted   Osteomyelitis of great toe of left foot [facility])    Past Medical History:  Diagnosis Date   COVID-19 2020   Diabetes mellitus without complication (HCC)    type 2   Dyspnea    wtih exertion   History of kidney stones    Peripheral neuropathy     Family History  Problem Relation Age of Onset   Diabetes Mother    Diabetes Father    Diabetes Brother     Past Surgical  History:  Procedure Laterality Date   AMPUTATION Left 04/29/2021   Procedure: LEFT GREAT TOE AMPUTATION;  Surgeon: [name], MD;  Location: [facility] OR;  Service: Orthopedics;  Laterality: Left;   MULTIPLE TOOTH EXTRACTIONS     Social History   Occupational History   Not on file  Tobacco Use   Smoking status: Never   Smokeless tobacco: Never  Vaping Use   Vaping Use: Never used  Substance and Sexual Activity   Alcohol use: Not Currently   Drug use: Never   Sexual activity: Not on file

## 2021-07-07 ENCOUNTER — Encounter: Payer: Self-pay | Admitting: Nutrition

## 2021-08-19 ENCOUNTER — Ambulatory Visit: Payer: 59 | Admitting: Nurse Practitioner

## 2021-08-22 ENCOUNTER — Ambulatory Visit: Payer: 59 | Admitting: Nutrition

## 2021-08-25 ENCOUNTER — Ambulatory Visit (INDEPENDENT_AMBULATORY_CARE_PROVIDER_SITE_OTHER): Payer: 59 | Admitting: Physician Assistant

## 2021-08-25 ENCOUNTER — Encounter: Payer: Self-pay | Admitting: Nutrition

## 2021-08-25 ENCOUNTER — Ambulatory Visit
Admission: EM | Admit: 2021-08-25 | Discharge: 2021-08-25 | Disposition: A | Discharge status: Home / Self Care | Payer: 59 | Attending: Emergency Medicine | Admitting: Emergency Medicine

## 2021-08-25 ENCOUNTER — Encounter: Payer: Self-pay | Admitting: Emergency Medicine

## 2021-08-25 ENCOUNTER — Ambulatory Visit: Payer: Self-pay

## 2021-08-25 ENCOUNTER — Other Ambulatory Visit: Payer: Self-pay

## 2021-08-25 ENCOUNTER — Encounter: Payer: 59 | Attending: Nurse Practitioner | Admitting: Nutrition

## 2021-08-25 VITALS — Ht 74.0 in | Wt 236.0 lb

## 2021-08-25 DIAGNOSIS — E118 Type 2 diabetes mellitus with unspecified complications: Secondary | ICD-10-CM | POA: Insufficient documentation

## 2021-08-25 DIAGNOSIS — E1165 Type 2 diabetes mellitus with hyperglycemia: Secondary | ICD-10-CM | POA: Insufficient documentation

## 2021-08-25 DIAGNOSIS — E1140 Type 2 diabetes mellitus with diabetic neuropathy, unspecified: Secondary | ICD-10-CM

## 2021-08-25 DIAGNOSIS — M79672 Pain in left foot: Secondary | ICD-10-CM

## 2021-08-25 DIAGNOSIS — L089 Local infection of the skin and subcutaneous tissue, unspecified: Secondary | ICD-10-CM

## 2021-08-25 DIAGNOSIS — IMO0002 Reserved for concepts with insufficient information to code with codable children: Secondary | ICD-10-CM

## 2021-08-25 MED ORDER — DOXYCYCLINE HYCLATE 100 MG PO CAPS: 100.0000 mg | ORAL_CAPSULE | Freq: Two times a day (BID) | ORAL | 0 refills | Status: DC

## 2021-08-25 NOTE — Progress Notes (Signed)
Office Visit Note   Patient: John Church           Date of Birth: 1976-06-05           MRN: [id number] Visit Date: 08/25/2021              Requested by: Lovey Newcomer, PA [address] [place],  [place] [address] PCP: Lovey Newcomer, [place]  Chief Complaint  Patient presents with   Left Foot - Pain     04/29/21 (22m 26d)   Left Great Toe Amputation - Left        HPI: Today for an unexpected visit.  He is status post left foot great toe amputation.  He has a history of diabetes and neuropathy.  Over the last couple days he has noticed swelling in his second and third toes on his left foot.  He denies any fever chills or malaise.  Assessment & Plan: Visit Diagnoses:  1. Pain in left foot     Plan: Patient will wear his compression sock around-the-clock changing to a clean sock every day.  Wash daily with antibacterial soap and water.  We will start him on doxycycline and a probiotic.  Evaluation in 1 week.  He was given specific return information if he has any groin redness swelling malaise fever or chills  Follow-Up Instructions: No follow-ups on file.   Ortho Exam  Patient is alert, oriented, no adenopathy, well-dressed, normal affect, normal respiratory effort. Left foot: He has a palpable dorsalis pedis pulse well-healed incision from great toe amputation.  He has some sausage digit swelling of the second and third toes with a small ulcer at the base of the lateral side of the second toe.  This does not probe deeply does not have any tunneling no ascending cellulitis  Imaging: No results found.   Labs: Lab Results  Component Value Date   REPTSTATUS 02/19/2012 FINAL 02/17/2012   GRAMSTAIN  02/17/2012    FEW WBC PRESENT, PREDOMINANTLY PMN NO SQUAMOUS EPITHELIAL CELLS SEEN MODERATE GRAM POSITIVE COCCI IN PAIRS FEW GRAM NEGATIVE RODS   CULT  02/17/2012    ABUNDANT GROUP B STREP(S.AGALACTIAE)ISOLATED Note: TESTING AGAINST S. AGALACTIAE NOT ROUTINELY PERFORMED DUE  TO PREDICTABILITY OF AMP/PEN/VAN SUSCEPTIBILITY.     Lab Results  Component Value Date   ALBUMIN 3.8 12/01/2019   ALBUMIN 3.7 06/23/2009    No results found for: MG No results found for: VD25OH  No results found for: PREALBUMIN CBC EXTENDED Latest Ref Rng & Units 04/24/2021 12/01/2019 06/23/2009  WBC 3.4 - 10.8 x10E3/uL 19.3(H) 4.6 13.4(H)  RBC 4.14 - 5.80 x10E6/uL 5.48 6.00(H) 4.97  HGB 13.0 - 17.7 g/dL 29.5 17.2(H) 15.1  HCT 37.5 - 51.0 % 47.3 50.2 42.8  PLT 150 - 450 x10E3/uL 358 130(L) 239  NEUTROABS 1.4 - 7.0 x10E3/uL 15.6(H) - 12.1(H)  LYMPHSABS 0.7 - 3.1 x10E3/uL 2.1 - 0.9     There is no height or weight on file to calculate BMI.  Orders:  Orders Placed This Encounter  Procedures   XR Foot Complete Left   No orders of the defined types were placed in this encounter.    Procedures: No procedures performed  Clinical Data: No additional findings.  ROS:  All other systems negative, except as noted in the HPI. Review of Systems  Objective: Vital Signs: There were no vitals taken for this visit.  Specialty Comments:  No specialty comments available.  PMFS History: Patient Active Problem List  Diagnosis Date Noted   Osteomyelitis of great toe of left foot [facility])    Past Medical History:  Diagnosis Date   COVID-19 2020   Diabetes mellitus without complication (HCC)    type 2   Dyspnea    wtih exertion   History of kidney stones    Peripheral neuropathy     Family History  Problem Relation Age of Onset   Diabetes Mother    Diabetes Father    Diabetes Brother     Past Surgical History:  Procedure Laterality Date   AMPUTATION Left 04/29/2021   Procedure: LEFT GREAT TOE AMPUTATION;  Surgeon: [name], MD;  Location: [facility] OR;  Service: Orthopedics;  Laterality: Left;   MULTIPLE TOOTH EXTRACTIONS     Social History   Occupational History   Not on file  Tobacco Use   Smoking status: Never   Smokeless tobacco: Never  Vaping Use   Vaping  Use: Never used  Substance and Sexual Activity   Alcohol use: Not Currently   Drug use: Never   Sexual activity: Not on file

## 2021-08-25 NOTE — Patient Instructions (Addendum)
Goals  Call PCP to discuss swollen toe middle toe  of left foot today. Keep up great job. Look up Lifestyle Medicine website. Prevent low blood sugars Set timer on phone for meal time reminders.

## 2021-08-25 NOTE — Progress Notes (Signed)
Medical Nutrition Therapy  Follow up Appointment Start time:  0940  Appointment End time:  1015  Primary concerns today: Dm Type 2  Referral diagnosis: E11.8 Preferred learning style: read   Learning readiness: ready    NUTRITION ASSESSMENT   Sees Ronny Bacon FNP next week.  He forgot to eat  to stop and eat lunch yesterday  and his BS dropped. His work staff had to get him up and get some soda in him.  BS came up to 178. His work didn't test before his BS dropped. Sees his PCP in September. Complains of a swollen toe-middle toe of his left foot. Advised him to call/see his PCP today due to history of osteomyelitis and prior toe amputation.   BS meter readings: 90 day 122 mg/dl, 30 day 388 mg/dl,  14 day 828 mg/dl. BS are doing fantastic.  Eating more vegetables. Eating meatless burgers and healthier foods.  Feels a lot better. Working on [employer] at home and taking food to work. Drinking a lot of water-a gallon a day at least.  He is wearing TED hose stockings.  Anthropometrics    Wt Readings from Last 3 Encounters:  05/19/21 234 lb 6.4 oz (106.3 kg)  05/04/21 231 lb (104.8 kg)  04/29/21 238 lb (108 kg)   Ht Readings from Last 3 Encounters:  05/19/21 6\' 1"  (1.854 m)  05/04/21 6\' 1"  (1.854 m)  04/29/21 6\' 2"  (1.88 m)   There is no height or weight on file to calculate BMI. @BMIFA @ Facility age limit for growth percentiles is 20 years. Facility age limit for growth percentiles is 20 years.  Clinical Medical Hx: Dm Type 2 Medications: Metformin Onglyza and Glipizide Labs: Last A1C 9.8%. Estimated A1C from readings is less than 7%.   Notable Signs/Symptoms: Numbness in feet and swelling in legs-left foots.  Lifestyle & Dietary Hx LIve with by himself. Cooks meals at home.  Has been eating more plant based foods.   Estimated daily fluid intake: 64-84 oz Supplements: none Sleep: 6-8 Stress / self-care: work and his diabetes Current average weekly physical  activity: not a lot since losing his toe  24-Hr Dietary Recall Eating more plant based foods, more vegetables and fruit. Cooking at home.   Estimated Energy Needs Calories: 1800-2000 Carbohydrate: 225 g Protein: 150 gg Fat: 56g   NUTRITION DIAGNOSIS  NB-1.1 Food and nutrition-related knowledge deficit As related to Diabetes Type 2.  As evidenced by A1C 9.8%.  NUTRITION INTERVENTION  Nutrition education (E-1) on the following topics:  Meal Planning Exercise   Handouts Provided Include  Jumpstart on Food as  Medicine   Learning Style & Readiness for Change Teaching method utilized: Visual & Auditory  Demonstrated degree of understanding via: Teach Back  Barriers to learning/adherence to lifestyle change: none  Goals Established by Pt Goals  Call PCP to discuss swollen toe middle toe  of left foot today. Keep up great job. Look up Lifestyle Medicine website. Prevent low blood sugars Set timer on phone for meal time reminders.   MONITORING & EVALUATION Dietary intake, weekly physical activity, and blood sugars in 3 months.  Next Steps  Patient is to work on weight loss and improved nutrition.

## 2021-08-25 NOTE — ED Provider Notes (Signed)
[facility]   [phone] 08/25/21 Arrival Time: 1025   CC: Toe infection  SUBJECTIVE:  John Church is a 45 y.o. male who presents with a possible toe infection.  Has noticed sores over second and third toes on LT foot over the past couple of days.  Hx significnat for DM with peripheral neuropathy.  Had great toe amputated in May of this year.  A result of untreated infection.  Denies pain due to neuropathy.  Reports chronic swelling.  Some drainage.  Denies fever, chills, nausea, vomiting.    ROS: As per HPI.  All other pertinent ROS negative.     Past Medical History:  Diagnosis Date   COVID-19 2020   Diabetes mellitus without complication (HCC)    type 2   Dyspnea    wtih exertion   History of kidney stones    Peripheral neuropathy    Past Surgical History:  Procedure Laterality Date   AMPUTATION Left 04/29/2021   Procedure: LEFT GREAT TOE AMPUTATION;  Surgeon: [name], MD;  Location: [facility] OR;  Service: Orthopedics;  Laterality: Left;   MULTIPLE TOOTH EXTRACTIONS     Allergies  Allergen Reactions   Penicillins     Did it involve swelling of the face/tongue/throat, SOB, or low BP? Unknown Did it involve sudden or severe rash/hives, skin peeling, or any reaction on the inside of your mouth or nose? Unknown Did you need to seek medical attention at a hospital or doctor's office? Unknown When did it last happen? 45 year old       If all above answers are "NO", may proceed with cephalosporin use.   CHILDHOOD ALLERGY   No current facility-administered medications on file prior to encounter.   Current Outpatient Medications on File Prior to Encounter  Medication Sig Dispense Refill   glipiZIDE (GLUCOTROL XL) 5 MG 24 hr tablet Take 1 tablet (5 mg total) by mouth daily with breakfast. 90 tablet 3   glucose blood (ACCU-CHEK GUIDE) test strip Use as instructed to monitor glucose 4 times daily 100 each 12   metFORMIN (GLUCOPHAGE) 500 MG tablet Take 2 tablets (1,000  mg total) by mouth 2 (two) times daily. 360 tablet 0   ONGLYZA 5 MG TABS tablet Take 1 tablet (5 mg total) by mouth every evening. 90 tablet 3   oxyCODONE-acetaminophen (PERCOCET) 5-325 MG tablet Take 1 tablet by mouth every 4 (four) hours as needed. (Patient not taking: Reported on 05/04/2021) 30 tablet 0   Social History   Socioeconomic History   Marital status: Single    Spouse name: Not on file   Number of children: Not on file   Years of education: Not on file   Highest education level: Not on file  Occupational History   Not on file  Tobacco Use   Smoking status: Never   Smokeless tobacco: Never  Vaping Use   Vaping Use: Never used  Substance and Sexual Activity   Alcohol use: Not Currently   Drug use: Never   Sexual activity: Not on file  Other Topics Concern   Not on file  Social History Narrative   Not on file   Social Determinants of Health   Financial Resource Strain: Not on file  Food Insecurity: Not on file  Transportation Needs: Not on file  Physical Activity: Not on file  Stress: Not on file  Social Connections: Not on file  Intimate Partner Violence: Not on file   Family History  Problem Relation Age  of Onset   Diabetes Mother    Diabetes Father    Diabetes Brother     OBJECTIVE:  Vitals:   08/25/21 1119  BP: (!) 150/92  Pulse: 79  Resp: 19  Temp: 98.1 F (36.7 C)  TempSrc: Oral  SpO2: 97%     General appearance: alert; no distress CV: dorsalis pedis pulse 2+; cap refill < 2 secs to second and third digits Skin: two superficial sores/ ulcers to base of second digit and medial aspect of third digit, yellow base with mild surrounding erythema, NTTP, some clear drainage, no bleeding; toes with swelling Psychological: alert and cooperative; normal mood and affect   ASSESSMENT & PLAN:  1. Toe infection   2. DM type 2, uncontrolled, with neuropathy (HCC)     Meds ordered this encounter  Medications   doxycycline (VIBRAMYCIN) 100 MG  capsule    Sig: Take 1 capsule (100 mg total) by mouth 2 (two) times daily.    Dispense:  20 capsule    Refill:  0    Order Specific Question:   Supervising Provider    Answer:   Eustace Moore [[id number]]   Unable to do rocephin injection here due to penicillin allergy Wash site daily with warm water and mild soap Take antibiotic as prescribed and to completion Follow up here or with Dr. Lajoyce Corners (orthopedic surgeon who did previous toe amputation) tomorrow for reevaluation Return or go to the ED if you have any new or worsening symptoms increased redness, swelling, pain, nausea, vomiting, fever, chills, etc...    Reviewed expectations re: course of current medical issues. Questions answered. Outlined signs and symptoms indicating need for more acute intervention. Patient verbalized understanding. After Visit Summary given.           Rennis Harding, PA-C 08/25/21 1201

## 2021-08-25 NOTE — ED Triage Notes (Signed)
Poss infection to middle toe on LT foot.  Pt had infection to LT great toe recently that had to be amputated.

## 2021-08-25 NOTE — Discharge Instructions (Addendum)
Unable to do rocephin injection here due to penicillin allergy Wash site daily with warm water and mild soap Take antibiotic as prescribed and to completion Follow up here or with Dr. Lajoyce Corners (orthopedic surgeon who did previous toe amputation) tomorrow for reevaluation Return or go to the ED if you have any new or worsening symptoms increased redness, swelling, pain, nausea, vomiting, fever, chills, etc..Marland Kitchen

## 2021-09-01 ENCOUNTER — Encounter: Payer: Self-pay | Admitting: Nurse Practitioner

## 2021-09-01 ENCOUNTER — Ambulatory Visit: Payer: 59 | Admitting: Nurse Practitioner

## 2021-09-01 ENCOUNTER — Other Ambulatory Visit: Payer: Self-pay

## 2021-09-01 VITALS — BP 121/84 | HR 84 | Ht 73.0 in | Wt 237.4 lb

## 2021-09-01 DIAGNOSIS — I10 Essential (primary) hypertension: Secondary | ICD-10-CM | POA: Diagnosis not present

## 2021-09-01 DIAGNOSIS — E1165 Type 2 diabetes mellitus with hyperglycemia: Secondary | ICD-10-CM

## 2021-09-01 DIAGNOSIS — E782 Mixed hyperlipidemia: Secondary | ICD-10-CM

## 2021-09-01 LAB — POCT GLYCOSYLATED HEMOGLOBIN (HGB A1C): Hemoglobin A1C: 6 % — AB (ref 4.0–5.6)

## 2021-09-01 NOTE — Progress Notes (Signed)
Endocrinology Follow Up Note       09/01/2021, 8:49 AM   Subjective:    Patient ID: John Church, male    DOB: 08-Sep-1976.  John Church is being seen in follow up after being seen in consultation for management of currently uncontrolled symptomatic diabetes requested by  Lovey Newcomer, PA.   Past Medical History:  Diagnosis Date   COVID-19 2020   Diabetes mellitus without complication (HCC)    type 2   Dyspnea    wtih exertion   History of kidney stones    Peripheral neuropathy     Past Surgical History:  Procedure Laterality Date   AMPUTATION Left 04/29/2021   Procedure: LEFT GREAT TOE AMPUTATION;  Surgeon: [name], MD;  Location: [facility] OR;  Service: Orthopedics;  Laterality: Left;   MULTIPLE TOOTH EXTRACTIONS      Social History   Socioeconomic History   Marital status: Single    Spouse name: Not on file   Number of children: Not on file   Years of education: Not on file   Highest education level: Not on file  Occupational History   Not on file  Tobacco Use   Smoking status: Never   Smokeless tobacco: Never  Vaping Use   Vaping Use: Never used  Substance and Sexual Activity   Alcohol use: Not Currently   Drug use: Never   Sexual activity: Not on file  Other Topics Concern   Not on file  Social History Narrative   Not on file   Social Determinants of Health   Financial Resource Strain: Not on file  Food Insecurity: Not on file  Transportation Needs: Not on file  Physical Activity: Not on file  Stress: Not on file  Social Connections: Not on file    Family History  Problem Relation Age of Onset   Diabetes Mother    Diabetes Father    Diabetes Brother     Outpatient Encounter Medications as of 09/01/2021  Medication Sig   doxycycline (VIBRAMYCIN) 100 MG capsule Take 1 capsule (100 mg total) by mouth 2 (two) times daily.   glipiZIDE (GLUCOTROL XL) 5 MG 24 hr tablet Take 1  tablet (5 mg total) by mouth daily with breakfast.   glucose blood (ACCU-CHEK GUIDE) test strip Use as instructed to monitor glucose 4 times daily   metFORMIN (GLUCOPHAGE) 500 MG tablet Take 2 tablets (1,000 mg total) by mouth 2 (two) times daily.   ONGLYZA 5 MG TABS tablet Take 1 tablet (5 mg total) by mouth every evening.   [DISCONTINUED] oxyCODONE-acetaminophen (PERCOCET) 5-325 MG tablet Take 1 tablet by mouth every 4 (four) hours as needed. (Patient not taking: No sig reported)   No facility-administered encounter medications on file as of 09/01/2021.    ALLERGIES: Allergies  Allergen Reactions   Penicillins     Did it involve swelling of the face/tongue/throat, SOB, or low BP? Unknown Did it involve sudden or severe rash/hives, skin peeling, or any reaction on the inside of your mouth or nose? Unknown Did you need to seek medical attention at a hospital or doctor's office? Unknown When did it last happen? 45 year old  If all above answers are "NO", may proceed with cephalosporin use.   CHILDHOOD ALLERGY    VACCINATION STATUS:  There is no immunization history on file for this patient.  Diabetes He presents for his follow-up diabetic visit. He has type 2 diabetes mellitus. Onset time: diagnosed at approx age of 45. His disease course has been improving. There are no hypoglycemic associated symptoms. Associated symptoms include fatigue. Pertinent negatives for diabetes include no polydipsia, no polyuria and no weight loss. There are no hypoglycemic complications. Symptoms are improving. 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 Current diabetic treatment includes oral agent (triple therapy). He is compliant with treatment most of the time. His weight is fluctuating minimally. He is following a generally healthy diet. Meal planning includes avoidance of  concentrated sweets and ADA exchanges. He has not had a previous visit with a dietitian. He rarely participates in exercise. His home blood glucose trend is decreasing steadily. His breakfast blood glucose range is generally 90-110 mg/dl. His bedtime blood glucose range is generally 90-110 mg/dl. (He presents today with his meter and logs showing at goal fasting and postprandial glycemic profile.  His POCT A1c today is 6%, improving from last visit of 9.8%.  He is now battling toe infection on 2 of the remaining toes on the foot where he previously had toe amputation.  He is currently on antibiotics.  He denies any hypoglycemia.) An ACE inhibitor/angiotensin II receptor blocker is not being taken. He does not see a podiatrist.Eye exam is not current.    Review of systems  Constitutional: + Minimally fluctuating body weight,  current Body mass index is 31.32 kg/m. , no fatigue, no subjective hyperthermia, no subjective hypothermia Eyes: no blurry vision, no xerophthalmia ENT: no sore throat, no nodules palpated in throat, no dysphagia/odynophagia, no hoarseness Cardiovascular: no chest pain, no shortness of breath, no palpitations, no leg swelling Respiratory: no cough, no shortness of breath Gastrointestinal: no nausea/vomiting/diarrhea Musculoskeletal: no muscle/joint aches Skin: no rashes, no hyperemia, toe infection-seeing orthopedic surgeon- on antibiotics Neurological: no tremors, no numbness, no tingling, no dizziness Psychiatric: no depression, no anxiety  Objective:     BP 121/84   Pulse 84   Ht 6\' 1"  (1.854 m)   Wt 237 lb 6.4 oz (107.7 kg)   BMI 31.32 kg/m   Wt Readings from Last 3 Encounters:  09/01/21 237 lb 6.4 oz (107.7 kg)  08/25/21 236 lb (107 kg)  05/19/21 234 lb 6.4 oz (106.3 kg)     BP Readings from Last 3 Encounters:  09/01/21 121/84  08/25/21 (!) 150/92  05/19/21 123/85      Physical Exam- Limited  Constitutional:  Body mass index is 31.32 kg/m. , not in  acute distress, normal state of mind Eyes:  EOMI, no exophthalmos Neck: Supple Cardiovascular: RRR, no murmurs, rubs, or gallops, no edema Respiratory: Adequate breathing efforts, no crackles, rales, rhonchi, or wheezing Musculoskeletal: no gross deformities, strength intact in all four extremities, no gross restriction of joint movements Skin:  no rashes, no hyperemia Neurological: no tremor with outstretched hands    CMP ( most recent) CMP     Component Value Date/Time   NA 131 (L) 04/29/2021 0756   K 4.7 04/29/2021 0756   CL 101 04/29/2021 0756   CO2 19 (L) 04/29/2021 0756   GLUCOSE 286 (H) 04/29/2021 0756   BUN 12 04/29/2021 0756   CREATININE 0.93  04/29/2021 0756   CALCIUM 8.7 (L) 04/29/2021 0756   PROT 8.4 (H) 12/01/2019 1717   ALBUMIN 3.8 12/01/2019 1717   AST 38 12/01/2019 1717   ALT 49 (H) 12/01/2019 1717   ALKPHOS 66 12/01/2019 1717   BILITOT 1.6 (H) 12/01/2019 1717   GFRNONAA >60 04/29/2021 0756   GFRAA >60 12/01/2019 1717     Diabetic Labs (most recent): Lab Results  Component Value Date   HGBA1C 6.0 (A) 09/01/2021     Lipid Panel ( most recent) Lipid Panel  No results found for: CHOL, TRIG, HDL, CHOLHDL, VLDL, LDLCALC, LDLDIRECT, LABVLDL    No results found for: TSH, FREET4         Assessment & Plan:   1) Type 2 diabetes mellitus with hyperglycemia, without long-term current use of insulin (HCC)  He presents today with his meter and logs showing at goal fasting and postprandial glycemic profile.  His POCT A1c today is 6%, improving from last visit of 9.8%.  He is now battling toe infection on 2 of the remaining toes on the foot where he previously had toe amputation.  He is currently on antibiotics.  He denies any hypoglycemia.  - John LamyJason S Neaves has currently uncontrolled symptomatic type 2 DM since 45 years of age.  -Recent labs reviewed.  - I had a long discussion with him about the progressive nature of diabetes and the pathology behind its  complications. -his diabetes is not currently complicated but he remains at a high risk for more acute and chronic complications which include CAD, CVA, CKD, retinopathy, and neuropathy. These are all discussed in detail with him.  - Nutritional counseling repeated at each appointment due to patients tendency to fall back in to old habits.  - The patient admits there is a room for improvement in their diet and drink choices. -  Suggestion is made for the patient to avoid simple carbohydrates from their diet including Cakes, Sweet Desserts / Pastries, Ice Cream, Soda (diet and regular), Sweet Tea, Candies, Chips, Cookies, Sweet Pastries, Store Bought Juices, Alcohol in Excess of 1-2 drinks a day, Artificial Sweeteners, Coffee Creamer, and "Sugar-free" Products. This will help patient to have stable blood glucose profile and potentially avoid unintended weight gain.   - I encouraged the patient to switch to unprocessed or minimally processed complex starch and increased protein intake (animal or plant source), fruits, and vegetables.   - Patient is advised to stick to a routine mealtimes to eat 3 meals a day and avoid unnecessary snacks (to snack only to correct hypoglycemia).  - he will be scheduled with Norm SaltPenny Crumpton, RDN, CDE for diabetes education.  - I have approached him with the following individualized plan to manage  his diabetes and patient agrees:   -Based on his improved, at goal glycemic profile, he is advised to continue current regimen of Metformin 1000 mg po twice daily with meals, Onglyza 5 mg po daily, and Glipizide 5 mg XL daily with breakfast.    -He is encouraged to continue monitoring blood glucose 1-2 times daily, before breakfast and before bed, and to call the clinic if he has readings less than 70 or greater than 300 for 3 tests in a row.  - Adjustment parameters are given to him for hypo and hyperglycemia in writing.  - Specific targets for  A1c;  LDL, HDL,  and  Triglycerides were discussed with the patient.  2) Blood Pressure /Hypertension:  his blood pressure is controlled to target.  He is not currently on any antihypertensive medications at this time.  3) Lipids/Hyperlipidemia:    Review of his recent lipid panel from 04/15/21 showed controlled LDL at 123 and elevated triglycerides of 168. He is not currently on any lipid lowering medications at this time. This will be discussed on subsequent visits.  4)  Weight/Diet:  his Body mass index is 31.32 kg/m.  -    he is a candidate for some weight loss. I discussed with him the fact that loss of 5 - 10% of his  current body weight will have the most impact on his diabetes management.  Exercise, and detailed carbohydrates information provided  -  detailed on discharge instructions.  5) Chronic Care/Health Maintenance: -he is not on ACEI/ARB or Statin medications and is encouraged to initiate and continue to follow up with Ophthalmology, Dentist, Podiatrist at least yearly or according to recommendations, and advised to stay away from smoking. I have recommended yearly flu vaccine and pneumonia vaccine at least every 5 years; moderate intensity exercise for up to 150 minutes weekly; and sleep for at least 7 hours a day.  - he is advised to maintain close follow up with Lovey Newcomer, PA for primary care needs, as well as his other providers for optimal and coordinated care.       I spent 30 minutes in the care of the patient today including review of labs from CMP, Lipids, Thyroid Function, Hematology (current and previous including abstractions from other facilities); face-to-face time discussing  his blood glucose readings/logs, discussing hypoglycemia and hyperglycemia episodes and symptoms, medications doses, his options of short and long term treatment based on the latest standards of care / guidelines;  discussion about incorporating lifestyle medicine;  and documenting the encounter.    Please  refer to Patient Instructions for Blood Glucose Monitoring and Insulin/Medications Dosing Guide"  in media tab for additional information. Please  also refer to " Patient Self Inventory" in the Media  tab for reviewed elements of pertinent patient history.  John Church participated in the discussions, expressed understanding, and voiced agreement with the above plans.  All questions were answered to his satisfaction. he is encouraged to contact clinic should he have any questions or concerns prior to his return visit.  Follow up plan: - Return in about 4 months (around 01/01/2022) for Diabetes F/U- A1c and UM in office, Previsit labs, Bring meter and logs.  Ronny Bacon, [facility] [address] [place], [place] [address] Phone: [phone] Fax: [phone]  09/01/2021, 8:49 AM

## 2021-09-01 NOTE — Patient Instructions (Signed)

## 2021-09-14 ENCOUNTER — Other Ambulatory Visit: Payer: Self-pay | Admitting: "Endocrinology

## 2021-09-26 ENCOUNTER — Ambulatory Visit: Payer: 59 | Admitting: Nutrition

## 2021-10-05 ENCOUNTER — Other Ambulatory Visit: Payer: Self-pay

## 2021-10-05 ENCOUNTER — Encounter: Payer: 59 | Attending: Nurse Practitioner | Admitting: Nutrition

## 2021-10-05 ENCOUNTER — Encounter: Payer: Self-pay | Admitting: Nutrition

## 2021-10-05 VITALS — Ht 74.0 in | Wt 247.0 lb

## 2021-10-05 DIAGNOSIS — E118 Type 2 diabetes mellitus with unspecified complications: Secondary | ICD-10-CM | POA: Insufficient documentation

## 2021-10-05 NOTE — Patient Instructions (Signed)
  Goals Established by Pt Goals Work on meal prep. Keep up the great job!

## 2021-10-05 NOTE — Progress Notes (Signed)
Medical Nutrition Therapy  Follow up Appointment Start time:  0940  Appointment End time:  1015  Primary concerns today: Dm Type 2  Referral diagnosis: E11.8 Preferred learning style: read   Learning readiness: ready    NUTRITION ASSESSMENT   Just rotated to nights from days. 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 Admits he will need to find some room for exercise outside of work. Went to podiatrist MD due to his  swollen toe. They did an xray. May have broken his toe and that caused the swelling. It's doing better now. Using a compression sock, left leg per podiatrist. FBS;18-120's.  Bedtime 130-140's. Last A1C 6%. Testing twice a day. Metformin 500 mg twice a day, Glipizide 5 mg once a day, Onglyza 5 mg once  a day. Denies low blood sugars. Anthropometrics    Wt Readings from Last 3 Encounters:  09/01/21 237 lb 6.4 oz (107.7 kg)  08/25/21 236 lb (107 kg)  05/19/21 234 lb 6.4 oz (106.3 kg)   Ht Readings from Last 3 Encounters:  09/01/21 6\' 1"  (1.854 m)  08/25/21 6\' 2"  (1.88 m)  05/19/21 6\' 1"  (1.854 m)   There is no height or weight on file to calculate BMI. @BMIFA @ Facility age limit for growth percentiles is 20 years. Facility age limit for growth percentiles is 20 years.  Clinical Medical Hx: Dm Type 2 Medications: Metformin Onglyza and Glipizide Labs: Last A1C 6% 9/22  Notable Signs/Symptoms: Numbness in feet and swelling in legs-left foots.  Lifestyle & Dietary Hx LIve with by himself. Cooks meals at home.  Has been eating more plant based foods. Working on meal planning and that has helped a lot.    Estimated daily fluid intake: 84 oz Supplements: none Sleep: 6-8 hrs Stress / self-care: work and his diabetes Current average weekly physical activity: not a lot since losing his toe  24-Hr Dietary Recall B)protein waffles with PB, water L) sandwich with deli meat, 05/21/21 bread and salad, water D) Chicken or meat, whole wheat bread or french  fries, water, fruit Eats fruit occasionally. Drinks only water   Estimated Energy Needs Calories: 1800-2000 Carbohydrate: 225 g Protein: 150 gg Fat: 56g   NUTRITION DIAGNOSIS  NB-1.1 Food and nutrition-related knowledge deficit As related to Diabetes Type 2.  As evidenced by A1C 9.8%.  NUTRITION INTERVENTION  Nutrition education (E-1) on the following topics:  Meal Planning Exercise   Handouts Provided Include     Learning Style & Readiness for Change Teaching method utilized: Visual & Auditory  Demonstrated degree of understanding via: Teach Back  Barriers to learning/adherence to lifestyle change: none  Goals Established by Pt Goals Work on meal prep. Keep up the great job!   MONITORING & EVALUATION Dietary intake, weekly physical activity, and blood sugars in 3 months.  Next Steps  Patient is to work on weight loss and improved nutrition.

## 2021-12-22 ENCOUNTER — Other Ambulatory Visit: Payer: Self-pay | Admitting: "Endocrinology

## 2021-12-30 ENCOUNTER — Ambulatory Visit
Admission: EM | Admit: 2021-12-30 | Discharge: 2021-12-30 | Disposition: A | Discharge status: Home / Self Care | Payer: 59

## 2021-12-30 ENCOUNTER — Other Ambulatory Visit: Payer: Self-pay

## 2021-12-30 DIAGNOSIS — L089 Local infection of the skin and subcutaneous tissue, unspecified: Secondary | ICD-10-CM

## 2021-12-30 DIAGNOSIS — E119 Type 2 diabetes mellitus without complications: Secondary | ICD-10-CM

## 2021-12-30 DIAGNOSIS — L729 Follicular cyst of the skin and subcutaneous tissue, unspecified: Secondary | ICD-10-CM | POA: Diagnosis not present

## 2021-12-30 MED ORDER — SULFAMETHOXAZOLE-TRIMETHOPRIM 800-160 MG PO TABS: 1.0000 | ORAL_TABLET | Freq: Two times a day (BID) | ORAL | 0 refills | Status: DC

## 2021-12-30 NOTE — ED Provider Notes (Signed)
[facility]   MRN: [id number] DOB: 03/12/1976  Subjective:   John Church is a 46 y.o. male presenting for 2 to 3-day history of acute onset persistent and worsening right upper back pain over his skin.  Patient has had a cyst to this area and would like to have it removed.  He has concerns about serious infection as he is a diabetic and has already had a toe amputation of the left foot from a bad infection.  No fever, nausea, vomiting, spontaneous drainage of pus or bleeding from the area in question.  No current facility-administered medications for this encounter.  Current Outpatient Medications:    doxycycline (VIBRAMYCIN) 100 MG capsule, Take 1 capsule (100 mg total) by mouth 2 (two) times daily., Disp: 20 capsule, Rfl: 0   glipiZIDE (GLUCOTROL XL) 5 MG 24 hr tablet, Take 1 tablet (5 mg total) by mouth daily with breakfast., Disp: 90 tablet, Rfl: 3   glucose blood (ACCU-CHEK GUIDE) test strip, Use as instructed to monitor glucose 4 times daily, Disp: 100 each, Rfl: 12   metFORMIN (GLUCOPHAGE) 500 MG tablet, Take 2 tablets by mouth twice daily, Disp: 360 tablet, Rfl: 0   ONGLYZA 5 MG TABS tablet, Take 1 tablet (5 mg total) by mouth every evening., Disp: 90 tablet, Rfl: 3   rosuvastatin (CRESTOR) 5 MG tablet, Take 5 mg by mouth daily., Disp: , Rfl:    Allergies  Allergen Reactions   Penicillins     Did it involve swelling of the face/tongue/throat, SOB, or low BP? Unknown Did it involve sudden or severe rash/hives, skin peeling, or any reaction on the inside of your mouth or nose? Unknown Did you need to seek medical attention at a hospital or doctor's office? Unknown When did it last happen? 46 year old       If all above answers are "NO", may proceed with cephalosporin use.   CHILDHOOD ALLERGY    Past Medical History:  Diagnosis Date   COVID-19 2020   Diabetes mellitus without complication ([place])    type 2   Dyspnea    wtih exertion   History of kidney  stones    Peripheral neuropathy      Past Surgical History:  Procedure Laterality Date   AMPUTATION Left 04/29/2021   Procedure: LEFT GREAT TOE AMPUTATION;  Surgeon: Newt Minion, MD;  Location: [facility];  Service: Orthopedics;  Laterality: Left;   MULTIPLE TOOTH EXTRACTIONS      Family History  Problem Relation Age of Onset   Diabetes Mother    Diabetes Father    Diabetes Brother     Social History   Tobacco Use   Smoking status: Never   Smokeless tobacco: Never  Vaping Use   Vaping Use: Never used  Substance Use Topics   Alcohol use: Not Currently   Drug use: Never    ROS   Objective:   Vitals: BP (!) 145/87 (BP Location: Right Arm)    Pulse 99    Temp 98.1 F (36.7 C) (Oral)    Resp 16    SpO2 98%   Physical Exam Constitutional:      General: He is not in acute distress.    Appearance: Normal appearance. He is well-developed and normal weight. He is not ill-appearing, toxic-appearing or diaphoretic.  HENT:     Head: Normocephalic and atraumatic.     Right Ear: External ear normal.     Left Ear: External ear normal.  Nose: Nose normal.     Mouth/Throat:     Pharynx: Oropharynx is clear.  Eyes:     General: No scleral icterus.       Right eye: No discharge.        Left eye: No discharge.     Extraocular Movements: Extraocular movements intact.     Pupils: Pupils are equal, round, and reactive to light.  Cardiovascular:     Rate and Rhythm: Normal rate.  Pulmonary:     Effort: Pulmonary effort is normal.  Musculoskeletal:     Cervical back: Normal range of motion.  Skin:      Neurological:     Mental Status: He is alert and oriented to person, place, and time.  Psychiatric:        Mood and Affect: Mood normal.        Behavior: Behavior normal.        Thought Content: Thought content normal.        Judgment: Judgment normal.    PROCEDURE NOTE: I&D of Abscess Verbal consent obtained. Local anesthesia with culture. Site cleansed with Betadine.  Incision of <1/2cm was made using an 11 blade, <1cc expressed consisting of a mixture of pus and serosanguinous fluid.  Punctum was removed.  Wound cavity was explored with curved hemostats and loculations loosened. Cleansed and dressed.   Assessment and Plan :   PDMP not reviewed this encounter.  1. Infected cyst of skin   2. Type 2 diabetes mellitus treated without insulin ([place])    Start Bactrim to address the infected cyst.  Recommend supportive care otherwise. Counseled patient on potential for adverse effects with medications prescribed/recommended today, ER and return-to-clinic precautions discussed, patient verbalized understanding.    Jaynee Eagles, [place] 12/30/21 [phone]

## 2021-12-30 NOTE — ED Triage Notes (Signed)
Patient presents to Urgent Care with complaints of a cyst located on his right upper side of his back. Noted after new years eve, pain has increased.   Denies fever or drainage.

## 2021-12-30 NOTE — Discharge Instructions (Signed)
Change her dressing 2-3 times daily through the weekend.  If you develop worsening swelling, redness, drainage of pus, fevers then return to our clinic as this could be a sign that your infection is worse.

## 2022-01-02 ENCOUNTER — Ambulatory Visit: Payer: 59 | Admitting: Nutrition

## 2022-01-02 ENCOUNTER — Ambulatory Visit: Payer: 59 | Admitting: Nurse Practitioner

## 2022-01-18 ENCOUNTER — Other Ambulatory Visit: Payer: Self-pay

## 2022-01-18 ENCOUNTER — Emergency Department (HOSPITAL_COMMUNITY): Payer: 59

## 2022-01-18 ENCOUNTER — Inpatient Hospital Stay (HOSPITAL_COMMUNITY)
Admission: EM | Admit: 2022-01-18 | Discharge: 2022-01-20 | DRG: 638 | Disposition: A | Discharge status: Home / Self Care | Payer: 59 | Attending: Internal Medicine | Admitting: Internal Medicine

## 2022-01-18 DIAGNOSIS — Z833 Family history of diabetes mellitus: Secondary | ICD-10-CM | POA: Diagnosis not present

## 2022-01-18 DIAGNOSIS — Z87442 Personal history of urinary calculi: Secondary | ICD-10-CM | POA: Diagnosis not present

## 2022-01-18 DIAGNOSIS — Z20822 Contact with and (suspected) exposure to covid-19: Secondary | ICD-10-CM | POA: Diagnosis present

## 2022-01-18 DIAGNOSIS — Z7984 Long term (current) use of oral hypoglycemic drugs: Secondary | ICD-10-CM

## 2022-01-18 DIAGNOSIS — M79606 Pain in leg, unspecified: Secondary | ICD-10-CM

## 2022-01-18 DIAGNOSIS — E1142 Type 2 diabetes mellitus with diabetic polyneuropathy: Secondary | ICD-10-CM | POA: Diagnosis present

## 2022-01-18 DIAGNOSIS — I10 Essential (primary) hypertension: Secondary | ICD-10-CM | POA: Diagnosis present

## 2022-01-18 DIAGNOSIS — Z880 Allergy status to penicillin: Secondary | ICD-10-CM | POA: Diagnosis not present

## 2022-01-18 DIAGNOSIS — I872 Venous insufficiency (chronic) (peripheral): Secondary | ICD-10-CM | POA: Diagnosis present

## 2022-01-18 DIAGNOSIS — Z89412 Acquired absence of left great toe: Secondary | ICD-10-CM | POA: Diagnosis not present

## 2022-01-18 DIAGNOSIS — E785 Hyperlipidemia, unspecified: Secondary | ICD-10-CM | POA: Diagnosis present

## 2022-01-18 DIAGNOSIS — E11628 Type 2 diabetes mellitus with other skin complications: Secondary | ICD-10-CM | POA: Diagnosis present

## 2022-01-18 DIAGNOSIS — E871 Hypo-osmolality and hyponatremia: Secondary | ICD-10-CM | POA: Diagnosis present

## 2022-01-18 DIAGNOSIS — S98132A Complete traumatic amputation of one left lesser toe, initial encounter: Secondary | ICD-10-CM | POA: Diagnosis not present

## 2022-01-18 DIAGNOSIS — E1169 Type 2 diabetes mellitus with other specified complication: Secondary | ICD-10-CM | POA: Diagnosis present

## 2022-01-18 DIAGNOSIS — E119 Type 2 diabetes mellitus without complications: Secondary | ICD-10-CM

## 2022-01-18 DIAGNOSIS — M86172 Other acute osteomyelitis, left ankle and foot: Secondary | ICD-10-CM | POA: Diagnosis present

## 2022-01-18 DIAGNOSIS — E1165 Type 2 diabetes mellitus with hyperglycemia: Secondary | ICD-10-CM | POA: Diagnosis present

## 2022-01-18 DIAGNOSIS — E860 Dehydration: Secondary | ICD-10-CM | POA: Diagnosis present

## 2022-01-18 DIAGNOSIS — E669 Obesity, unspecified: Secondary | ICD-10-CM | POA: Diagnosis present

## 2022-01-18 DIAGNOSIS — Z79899 Other long term (current) drug therapy: Secondary | ICD-10-CM | POA: Diagnosis not present

## 2022-01-18 DIAGNOSIS — Z8616 Personal history of COVID-19: Secondary | ICD-10-CM

## 2022-01-18 DIAGNOSIS — M869 Osteomyelitis, unspecified: Secondary | ICD-10-CM

## 2022-01-18 DIAGNOSIS — L03032 Cellulitis of left toe: Secondary | ICD-10-CM | POA: Diagnosis present

## 2022-01-18 DIAGNOSIS — Z6832 Body mass index (BMI) 32.0-32.9, adult: Secondary | ICD-10-CM

## 2022-01-18 LAB — CBC WITH DIFFERENTIAL/PLATELET
Abs Immature Granulocytes: 0.04 10*3/uL (ref 0.00–0.07)
Basophils Absolute: 0.1 10*3/uL (ref 0.0–0.1)
Basophils Relative: 1 %
Eosinophils Absolute: 0.4 10*3/uL (ref 0.0–0.5)
Eosinophils Relative: 4 %
HCT: 46.1 % (ref 39.0–52.0)
Hemoglobin: 16 g/dL (ref 13.0–17.0)
Immature Granulocytes: 0 %
Lymphocytes Relative: 23 %
Lymphs Abs: 2.2 10*3/uL (ref 0.7–4.0)
MCH: 30.1 pg (ref 26.0–34.0)
MCHC: 34.7 g/dL (ref 30.0–36.0)
MCV: 86.7 fL (ref 80.0–100.0)
Monocytes Absolute: 0.6 10*3/uL (ref 0.1–1.0)
Monocytes Relative: 6 %
Neutro Abs: 6.3 10*3/uL (ref 1.7–7.7)
Neutrophils Relative %: 66 %
Platelets: 226 10*3/uL (ref 150–400)
RBC: 5.32 MIL/uL (ref 4.22–5.81)
RDW: 12 % (ref 11.5–15.5)
WBC: 9.5 10*3/uL (ref 4.0–10.5)
nRBC: 0 % (ref 0.0–0.2)

## 2022-01-18 LAB — BASIC METABOLIC PANEL
Anion gap: 9 (ref 5–15)
BUN: 13 mg/dL (ref 6–20)
CO2: 26 mmol/L (ref 22–32)
Calcium: 8.8 mg/dL — ABNORMAL LOW (ref 8.9–10.3)
Chloride: 101 mmol/L (ref 98–111)
Creatinine, Ser: 0.82 mg/dL (ref 0.61–1.24)
GFR, Estimated: >60 mL/min (ref >60)
Glucose, Bld: 156 mg/dL — ABNORMAL HIGH (ref 70–99)
Potassium: 4.1 mmol/L (ref 3.5–5.1)
Sodium: 136 mmol/L (ref 135–145)

## 2022-01-18 LAB — CBG MONITORING, ED
Glucose-Capillary: 167 mg/dL — ABNORMAL HIGH (ref 70–99)
Glucose-Capillary: 172 mg/dL — ABNORMAL HIGH (ref 70–99)

## 2022-01-18 LAB — SEDIMENTATION RATE: Sed Rate: 22 mm/hr — ABNORMAL HIGH (ref 0–16)

## 2022-01-18 LAB — C-REACTIVE PROTEIN: CRP: 1 mg/dL — ABNORMAL HIGH (ref <1.0)

## 2022-01-18 LAB — RESP PANEL BY RT-PCR (FLU A&B, COVID) ARPGX2
Influenza A by PCR: NEGATIVE
Influenza B by PCR: NEGATIVE
SARS Coronavirus 2 by RT PCR: NEGATIVE

## 2022-01-18 MED ORDER — VANCOMYCIN HCL IN DEXTROSE 1-5 GM/200ML-% IV SOLN
1000.0000 mg | Freq: Once | INTRAVENOUS | Status: AC
  Administered 2022-01-18: 16:00:00 1000 mg via INTRAVENOUS
  Filled 2022-01-18: qty 200

## 2022-01-18 MED ORDER — ROSUVASTATIN CALCIUM 5 MG PO TABS
5.0000 mg | ORAL_TABLET | Freq: Every day | ORAL | Status: DC
  Administered 2022-01-18 – 2022-01-20 (×3): 5 mg via ORAL
  Filled 2022-01-18 (×3): qty 1

## 2022-01-18 MED ORDER — ONDANSETRON HCL 4 MG PO TABS: 4.0000 mg | ORAL_TABLET | Freq: Four times a day (QID) | ORAL | Status: DC | PRN

## 2022-01-18 MED ORDER — ONDANSETRON HCL 4 MG/2ML IJ SOLN: 4.0000 mg | Freq: Four times a day (QID) | INTRAMUSCULAR | Status: DC | PRN

## 2022-01-18 MED ORDER — INSULIN ASPART 100 UNIT/ML IJ SOLN
0.0000 [IU] | Freq: Four times a day (QID) | INTRAMUSCULAR | Status: DC
  Administered 2022-01-18 – 2022-01-19 (×4): 2 [IU] via SUBCUTANEOUS
  Administered 2022-01-19: 1 [IU] via SUBCUTANEOUS
  Administered 2022-01-20: 3 [IU] via SUBCUTANEOUS
  Administered 2022-01-20: 2 [IU] via SUBCUTANEOUS
  Administered 2022-01-20: 3 [IU] via SUBCUTANEOUS
  Filled 2022-01-18 (×3): qty 1

## 2022-01-18 MED ORDER — ACETAMINOPHEN 650 MG RE SUPP: 650.0000 mg | Freq: Four times a day (QID) | RECTAL | Status: DC | PRN

## 2022-01-18 MED ORDER — ACETAMINOPHEN 325 MG PO TABS: 650.0000 mg | ORAL_TABLET | Freq: Four times a day (QID) | ORAL | Status: DC | PRN

## 2022-01-18 MED ORDER — POLYETHYLENE GLYCOL 3350 17 G PO PACK: 17.0000 g | PACK | Freq: Every day | ORAL | Status: DC | PRN

## 2022-01-18 NOTE — ED Triage Notes (Signed)
Patient states greater toe on left foot had to be amputated and has hx of neuropathy last year. Patient states 5th toe on left foot is black, edematous with erythema and pain in the nail bed. Hx of DM.

## 2022-01-18 NOTE — ED Notes (Signed)
This RN verified with dietary- dinner tray has been ordered

## 2022-01-18 NOTE — ED Provider Notes (Signed)
[facility] EMERGENCY DEPARTMENT Provider Note   CSN: [id number] Arrival date & time: 01/18/22  1201     History  Chief Complaint  Patient presents with   Wound Infection    John Church is a 46 y.o. 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 Lajoyce Corners) presenting for evaluation of a wound on his left third toe.  He initially noticed a dark area of skin at this distal toe which has since sloughed away and now there is a red slightly oozing wound.  In addition he has some redness and pain which radiates into his proximal toe.  He denies any injuries to the toe to his knowledge.  He also denies fevers or chills, weakness, nausea, vomiting or any other constitutional symptoms.  He does endorse he has not taken his diabetes medications today but he also has had no p.o. intake today as well either.  He is concerned as this is how his great toe presented prior to need of amputation.  He has been using soap and water to keep the toe clean, there is been no other interventions.  The history is provided by the patient.      Home Medications Prior to Admission medications   Medication Sig Start Date End Date Taking? Authorizing Provider  doxycycline (VIBRAMYCIN) 100 MG capsule Take 1 capsule (100 mg total) by mouth 2 (two) times daily. 08/25/21   Wurst, [place], PA-C  glipiZIDE (GLUCOTROL XL) 5 MG 24 hr tablet Take 1 tablet (5 mg total) by mouth daily with breakfast. 05/04/21   Dani Gobble, NP  glucose blood (ACCU-CHEK GUIDE) test strip Use as instructed to monitor glucose 4 times daily 05/04/21   Dani Gobble, NP  metFORMIN (GLUCOPHAGE) 500 MG tablet Take 2 tablets by mouth twice daily 12/22/21   Roma Kayser, MD  ONGLYZA 5 MG TABS tablet Take 1 tablet (5 mg total) by mouth every evening. 05/19/21   Dani Gobble, NP  rosuvastatin (CRESTOR) 5 MG tablet  Take 5 mg by mouth daily. 11/08/21   [provider]  sulfamethoxazole-trimethoprim (BACTRIM DS) 800-160 MG tablet Take 1 tablet by mouth 2 (two) times daily. 12/30/21   Wallis Bamberg, PA-C      Allergies    Penicillins    Review of Systems   Review of Systems  Constitutional:  Negative for chills and fever.  Gastrointestinal:  Negative for nausea and vomiting.  Musculoskeletal:  Negative for joint swelling and myalgias.  Skin:  Positive for color change and wound.  Neurological:  Negative for weakness and numbness.  All other systems reviewed and are negative.  Physical Exam Updated Vital Signs BP 140/90    Pulse 83    Temp 98.4 F (36.9 C) (Oral)    Resp 16    Ht 6\' 2"  (1.88 m)    Wt 113.4 kg    SpO2 99%    BMI 32.10 kg/m  Physical Exam Vitals and nursing note reviewed.  Constitutional:      Appearance: He is well-developed.  HENT:     Head: Normocephalic and atraumatic.  Eyes:     Conjunctiva/sclera: Conjunctivae normal.  Cardiovascular:     Rate and Rhythm: Normal rate.     Pulses:          Dorsalis pedis pulses are 2+ on the left side.  Pulmonary:     Effort: Pulmonary  effort is normal.     Breath sounds: No wheezing.  Musculoskeletal:        General: Swelling and tenderness present. Normal range of motion.     Cervical back: Normal range of motion.     Comments: TTP left 3rd toe over the nail plate.  Erythema dorsal and distal toe with abrasion distally, small honey colored clear drop of fluid at the abraded tip.   No red streaking.  Skin:    General: Skin is warm and dry.  Neurological:     Mental Status: He is alert.    ED Results / Procedures / Treatments   Labs (all labs ordered are listed, but only abnormal results are displayed) Labs Reviewed  BASIC METABOLIC PANEL - Abnormal; Notable for the following components:      Result Value   Glucose, Bld 156 (*)    Calcium 8.8 (*)    All other components within normal limits  RESP PANEL BY RT-PCR (FLU  A&B, COVID) ARPGX2  CBC WITH DIFFERENTIAL/PLATELET    EKG None  Radiology DG Foot Complete Left  Result Date: 01/18/2022 CLINICAL DATA:  Pain.  Wound on third toe. EXAM: LEFT FOOT - COMPLETE 3+ VIEW COMPARISON:  Left foot radiographs 08/25/2021 FINDINGS: Large calcaneal heel spur. Mild chronic spurring at the Achilles insertion on the calcaneus, unchanged. Appearance severe navicular-lateral cuneiform > navicular-cuboid and intermediate cuneiform joint space narrowing with unchanged dorsal bone hypertrophy at the level of the navicular on lateral view, likely degenerative. Postsurgical changes are again seen of amputation of the great toe phalanges. Reportedly there is a left third toe wound. There is mild erosion of the distal medial aspect of the proximal phalanx of the third toe with possible punctate erosion of the adjacent medial base of the middle phalanx. There is ill-defined mineralization that appears mildly separated from the distal medial aspect of the proximal phalanx. Findings are suspicious for osteomyelitis. Previously there was mild curvilinear mineralization medial to the distal aspect of the proximal phalanx without definite bone erosion seen. 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 IMPRESSION:: IMPRESSION: 1. Cortical erosion suspicious for osteomyelitis of the distal medial aspect of the proximal phalanx and possibly minimally within the adjacent middle phalanx of the third toe. 2. Additional chronic findings as above. Electronically Signed   By: Neita Garnetonald  Viola M.D.   On: 01/18/2022 14:26    Procedures Procedures    Medications Ordered in ED Medications  vancomycin (VANCOCIN) IVPB 1000 mg/200 mL premix (1,000 mg Intravenous New Bag/Given 01/18/22 1540)    ED Course/ Medical Decision Making/ A&P                           Medical Decision Making Patient with left third toe swelling and wound,  history of osteomyelitis with prior amputation of the left great toe, concerning for new infection/possible osteomyelitis.  No other complaints, no history of trauma to the toe.  No fevers or chills.  Amount and/or Complexity of Data Reviewed Labs: ordered.    Details: Labs are reassuring, he is got a normal WBC count, he does have a glucose elevation at 156, there is no anion gap and has a normal CO2. Radiology: ordered and independent interpretation performed.    Details: X-ray concerning for osteomyelitis of the third toe phalanxes.  Risk Prescription drug management. Decision regarding hospitalization. Risk Details: Patient with a new osteomyelitis of his left third toe.  He will benefit from IV antibiotics, he was given a dose of vancomycin, of note patient is penicillin allergic, he was a child when he had allergy, does not know the symptoms but states he was told it is "severe".  Patient will benefit from hospitalization for further IV antibiotics.  Will discuss with hospitalist.  Vancomycin ordered.   Pt discussed with Dr. Mariea Clonts who accepts pt for admission.        Final Clinical Impression(s) / ED Diagnoses Final diagnoses:  Osteomyelitis of left foot, unspecified type [facility])    Rx / DC Orders ED Discharge Orders     None         Victoriano Lain 01/18/22 1613    Eber Hong, MD 01/28/22 2119

## 2022-01-18 NOTE — H&P (Addendum)
History and Physical    John Church YME:[id number] DOB: [birth year] DOA: 01/18/2022  PCP: Lavella Lemons, PA   Patient coming from: Home  I have personally briefly reviewed patient's old medical records in [place]  Chief Complaint: left 3rd toe discoloration  HPI: John Church is a 46 y.o. male with medical history significant for diabetes mellitus, osteomyelitis and amputation of left foot great toe. Patient presented to the ED with complaints of discoloration to his left third toe noticed 3 days ago.  Initially reddish then turned black.  He has peripheral neuropathy, so he has not had any pain to his foot except when he puts pressure on his toe. Patient had amputation to left foot big toe 04/2021, done by Dr. Sharol Given.  Family history of toe amputations, his brother also diabetic, has had similar amputations also in the past year.  He otherwise denies any other complaints.  Reports compliance with his medications for his diabetes.  ED Course: Temperature 98.4.  Stable vitals.  WBC 9.5.  Left foot x-ray- shows cortical erosion suspicious for osteomyelitis of the distal medial aspect of the proximal phalanx and proximal possibly minimally within the adjacent middle phalanx of the third toe. IV vancomycin started. Hospitalist to admit.  Review of Systems: As per HPI all other systems reviewed and negative.  Past Medical History:  Diagnosis Date   COVID-19 2020   Diabetes mellitus without complication ([place])    type 2   Dyspnea    wtih exertion   History of kidney stones    Peripheral neuropathy     Past Surgical History:  Procedure Laterality Date   AMPUTATION Left 04/29/2021   Procedure: LEFT GREAT TOE AMPUTATION;  Surgeon: Newt Minion, MD;  Location: [facility];  Service: Orthopedics;  Laterality: Left;   MULTIPLE TOOTH EXTRACTIONS       reports that he has never smoked. He has never used smokeless tobacco. He reports that he does not currently use alcohol. He reports  that he does not use drugs.  Allergies  Allergen Reactions   Penicillins     Did it involve swelling of the face/tongue/throat, SOB, or low BP? Unknown Did it involve sudden or severe rash/hives, skin peeling, or any reaction on the inside of your mouth or nose? Unknown Did you need to seek medical attention at a hospital or doctor's office? Unknown When did it last happen? 46 year old       If all above answers are "NO", may proceed with cephalosporin use.   CHILDHOOD ALLERGY    Family History  Problem Relation Age of Onset   Diabetes Mother    Diabetes Father    Diabetes Brother    Prior to Admission medications   Medication Sig Start Date End Date Taking? Authorizing Provider  doxycycline (VIBRAMYCIN) 100 MG capsule Take 1 capsule (100 mg total) by mouth 2 (two) times daily. 08/25/21   Wurst, [place], PA-C  glipiZIDE (GLUCOTROL XL) 5 MG 24 hr tablet Take 1 tablet (5 mg total) by mouth daily with breakfast. 05/04/21   Brita Romp, NP  glucose blood (ACCU-CHEK GUIDE) test strip Use as instructed to monitor glucose 4 times daily 05/04/21   Brita Romp, NP  metFORMIN (GLUCOPHAGE) 500 MG tablet Take 2 tablets by mouth twice daily 12/22/21   Cassandria Anger, MD  ONGLYZA 5 MG TABS tablet Take 1 tablet (5 mg total) by mouth every evening. 05/19/21   Brita Romp, NP  rosuvastatin (CRESTOR) 5 MG tablet Take 5 mg by mouth daily. 11/08/21   [provider]  sulfamethoxazole-trimethoprim (BACTRIM DS) 800-160 MG tablet Take 1 tablet by mouth 2 (two) times daily. 12/30/21   Jaynee Eagles, PA-C    Physical Exam: Vitals:   01/18/22 1330 01/18/22 1430 01/18/22 1515 01/18/22 1545  BP: (!) 141/84 138/89 139/89 140/90  Pulse: 90 80 84 83  Resp: 18 18 18 16   Temp:      TempSrc:      SpO2: 96% 97% 98% 99%  Weight:      Height:        Constitutional: NAD, calm, comfortable Vitals:   01/18/22 1330 01/18/22 1430 01/18/22 1515 01/18/22 1545  BP: (!) 141/84 138/89  139/89 140/90  Pulse: 90 80 84 83  Resp: 18 18 18 16   Temp:      TempSrc:      SpO2: 96% 97% 98% 99%  Weight:      Height:       Eyes: PERRL, lids and conjunctivae normal ENMT: Mucous membranes are moist.  Neck: normal, supple, no masses, no thyromegaly Respiratory: clear to auscultation bilaterally, no wheezing, no crackles. Normal respiratory effort. No accessory muscle use.  Cardiovascular: Regular rate and rhythm, no murmurs / rubs / gallops. No extremity edema. 2+ pedal pulses.   Abdomen: no tenderness, no masses palpated. No hepatosplenomegaly. Bowel sounds positive.  Musculoskeletal: no clubbing / cyanosis. No joint deformity upper and lower extremities.   Skin: no rashes, lesions, ulcers. No induration Neurologic: No apparent cranial nerve abnormality, moving extremities spontaneously Psychiatric: Normal judgment and insight. Alert and oriented x 3. Normal mood.        Labs on Admission: I have personally reviewed following labs and imaging studies  CBC: Recent Labs  Lab 01/18/22 1423  WBC 9.5  NEUTROABS 6.3  HGB 16.0  HCT 46.1  MCV 86.7  PLT 403   Basic Metabolic Panel: Recent Labs  Lab 01/18/22 1423  NA 136  K 4.1  CL 101  CO2 26  GLUCOSE 156*  BUN 13  CREATININE 0.82  CALCIUM 8.8*    Radiological Exams on Admission: DG Foot Complete Left  Result Date: 01/18/2022 CLINICAL DATA:  Pain.  Wound on third toe. EXAM: LEFT FOOT - COMPLETE 3+ VIEW COMPARISON:  Left foot radiographs 08/25/2021 FINDINGS: Large calcaneal heel spur. Mild chronic spurring at the Achilles insertion on the calcaneus, unchanged. Appearance severe navicular-lateral cuneiform > navicular-cuboid and intermediate cuneiform joint space narrowing with unchanged dorsal bone hypertrophy at the level of the navicular on lateral view, likely degenerative. Postsurgical changes are again seen of amputation of the great toe phalanges. Reportedly there is a left third toe wound. There is mild  erosion of the distal medial aspect of the proximal phalanx of the third toe with possible punctate erosion of the adjacent medial base of the middle phalanx. There is ill-defined mineralization that appears mildly separated from the distal medial aspect of the proximal phalanx. Findings are suspicious for osteomyelitis. Previously there was mild curvilinear mineralization medial to the distal aspect of the proximal phalanx without definite bone erosion seen. 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 IMPRESSION:: IMPRESSION: 1. Cortical erosion suspicious for osteomyelitis of the distal medial aspect of the proximal phalanx and possibly minimally within the adjacent middle phalanx of the third toe. 2. Additional chronic findings as above. Electronically Signed   By: Yvonne Kendall M.D.   On: 01/18/2022 14:26  EKG: None.   Assessment/Plan Principal Problem:   Toe osteomyelitis, left (HCC) Active Problems:   DM (diabetes mellitus) (HCC)   Amputated toe of left foot (HCC)   Left 3rd Toe Osteomyelitis-  WBC- 9.5. Afebrile. Well appearing. Rules out for sepsis. Xray showing Cortical erosion suspicious for osteomyelitis of the distal medial aspect of the proximal phalanx and possibly minimally within the adjacent middle phalanx of the third toe. - Amputation of left big toe 04/2021 by Dr. Sharol Given. Secure chat with Dr Sharol Given, recommended if ABI is normal , call Ortho, but if abnormal call [facility] for intervention. Dr. Sharol Given is out of town.  - Admit to [facility] - IV Vanc given in ED, hold further antibiotics for now.  - ESR, CRP pending - NPO midinght - Obtain ABI  DM- Reports compliance with meds- Metformin, glipizide, onglyza(sitagliptin) - SSI- S, q6h - Hgba1c - Hold home meds  DVT prophylaxis: SCDs Code Status: Full code Family Communication: None at bedside Disposition Plan: ~ 2days Consults called: Ortho Admission status: Inpt,  med surg I certify that at the point of admission it is my clinical judgment that the patient will require inpatient hospital care spanning beyond 2 midnights from the point of admission due to high intensity of service, high risk for further deterioration and high frequency of surveillance required.    Bethena Roys MD [facility]  01/18/2022, 7:29 PM

## 2022-01-18 NOTE — ED Notes (Signed)
Hospitalist at bedside 

## 2022-01-19 ENCOUNTER — Inpatient Hospital Stay (HOSPITAL_COMMUNITY): Payer: 59

## 2022-01-19 LAB — CBC
HCT: 44.3 % (ref 39.0–52.0)
Hemoglobin: 15 g/dL (ref 13.0–17.0)
MCH: 29.6 pg (ref 26.0–34.0)
MCHC: 33.9 g/dL (ref 30.0–36.0)
MCV: 87.5 fL (ref 80.0–100.0)
Platelets: 208 10*3/uL (ref 150–400)
RBC: 5.06 MIL/uL (ref 4.22–5.81)
RDW: 12 % (ref 11.5–15.5)
WBC: 8.7 10*3/uL (ref 4.0–10.5)
nRBC: 0 % (ref 0.0–0.2)

## 2022-01-19 LAB — HEMOGLOBIN A1C
Hgb A1c MFr Bld: 8.2 % — ABNORMAL HIGH (ref 4.8–5.6)
Mean Plasma Glucose: 189 mg/dL

## 2022-01-19 LAB — BASIC METABOLIC PANEL
Anion gap: 6 (ref 5–15)
BUN: 15 mg/dL (ref 6–20)
CO2: 24 mmol/L (ref 22–32)
Calcium: 8.1 mg/dL — ABNORMAL LOW (ref 8.9–10.3)
Chloride: 101 mmol/L (ref 98–111)
Creatinine, Ser: 0.82 mg/dL (ref 0.61–1.24)
GFR, Estimated: >60 mL/min (ref >60)
Glucose, Bld: 140 mg/dL — ABNORMAL HIGH (ref 70–99)
Potassium: 3.5 mmol/L (ref 3.5–5.1)
Sodium: 131 mmol/L — ABNORMAL LOW (ref 135–145)

## 2022-01-19 LAB — CBG MONITORING, ED
Glucose-Capillary: 134 mg/dL — ABNORMAL HIGH (ref 70–99)
Glucose-Capillary: 142 mg/dL — ABNORMAL HIGH (ref 70–99)

## 2022-01-19 LAB — HIV ANTIBODY (ROUTINE TESTING W REFLEX): HIV Screen 4th Generation wRfx: NONREACTIVE

## 2022-01-19 LAB — GLUCOSE, CAPILLARY
Glucose-Capillary: 164 mg/dL — ABNORMAL HIGH (ref 70–99)
Glucose-Capillary: 191 mg/dL — ABNORMAL HIGH (ref 70–99)

## 2022-01-19 MED ORDER — VANCOMYCIN HCL 1500 MG/300ML IV SOLN
1500.0000 mg | Freq: Two times a day (BID) | INTRAVENOUS | Status: DC
  Administered 2022-01-19 – 2022-01-20 (×2): 1500 mg via INTRAVENOUS
  Filled 2022-01-19 (×2): qty 300

## 2022-01-19 MED ORDER — SODIUM CHLORIDE 0.9 % IV BOLUS
1000.0000 mL | Freq: Once | INTRAVENOUS | Status: AC
  Administered 2022-01-19: 1000 mL via INTRAVENOUS

## 2022-01-19 MED ORDER — LABETALOL HCL 5 MG/ML IV SOLN: 10.0000 mg | INTRAVENOUS | Status: DC | PRN

## 2022-01-19 MED ORDER — VANCOMYCIN HCL 2000 MG/400ML IV SOLN
2000.0000 mg | Freq: Once | INTRAVENOUS | Status: AC
  Administered 2022-01-19: 2000 mg via INTRAVENOUS
  Filled 2022-01-19: qty 400

## 2022-01-19 MED ORDER — INFLUENZA VAC SPLIT QUAD 0.5 ML IM SUSY: 0.5000 mL | PREFILLED_SYRINGE | INTRAMUSCULAR | Status: DC

## 2022-01-19 MED ORDER — SODIUM CHLORIDE 0.9 % IV SOLN: INTRAVENOUS | Status: DC

## 2022-01-19 MED ORDER — GADOBUTROL 1 MMOL/ML IV SOLN
10.0000 mL | Freq: Once | INTRAVENOUS | Status: AC | PRN
  Administered 2022-01-19: 10 mL via INTRAVENOUS

## 2022-01-19 NOTE — Progress Notes (Signed)
°  Transition of Care [facility]) Screening Note   Patient Details  Name: John Church Date of Birth: October 24, 1976   [facility]) CM/SW Contact:    Leitha Bleak, RN Phone Number: 01/19/2022, 10:28 AM  Transfer to [facility] for surgical services.   Transition of Care Department [facility]) has reviewed patient and no TOC needs have been identified at this time. We will continue to monitor patient advancement through interdisciplinary progression rounds. If new patient transition needs arise, please place a TOC consult.

## 2022-01-19 NOTE — Progress Notes (Signed)
Pharmacy Antibiotic Note  John Church is a 46 y.o. male admitted on 01/18/2022 with  osteomyelitis  .  Pharmacy has been consulted for Vancomycin dosing.  Plan: Vancomycin 2000 mg IV x 1 dose. Vancomycin 1500 mg IV every 12 hours. Expected AUC 481 Monitor labs, c/s, and vanco level as indicated.  Height: 6\' 2"  (188 cm) Weight: 113.4 kg (250 lb) IBW/kg (Calculated) : 82.2  Temp (24hrs), Avg:98.4 F (36.9 C), Min:98.4 F (36.9 C), Max:98.4 F (36.9 C)  Recent Labs  Lab 01/18/22 1423 01/19/22 0357  WBC 9.5 8.7  CREATININE 0.82 0.82    Estimated Creatinine Clearance: 152.4 mL/min (by C-G formula based on SCr of 0.82 mg/dL).    Allergies  Allergen Reactions   Penicillins     Did it involve swelling of the face/tongue/throat, SOB, or low BP? Unknown Did it involve sudden or severe rash/hives, skin peeling, or any reaction on the inside of your mouth or nose? Unknown Did you need to seek medical attention at a hospital or doctor's office? Unknown When did it last happen? 46 year old       If all above answers are "NO", may proceed with cephalosporin use.   CHILDHOOD ALLERGY    Antimicrobials this admission: Vanco 1/26 >>  Microbiology results: 1/25 BCx: ngtd   Thank you for allowing pharmacy to be a part of this patients care.  2/25 01/19/2022 11:24 AM

## 2022-01-19 NOTE — Progress Notes (Signed)
Patient with Osteomyelitis to his left third toe and his right big toe is pinkish, denies any discomforts.

## 2022-01-19 NOTE — ED Notes (Signed)
Patient ambulated to bathroom and meal tray and personal belongings placed within reach.

## 2022-01-19 NOTE — Consult Note (Addendum)
Patient ID: RAMIL EDGINGTON MRN: [id number] DOB/AGE: [id number] y.o.  Admit date: 01/18/2022  Admission Diagnoses:  Principal Problem:   Toe osteomyelitis, left (HCC) Active Problems:   DM (diabetes mellitus) (HCC)   Amputated toe of left foot (HCC)   HPI: The patient is a 46 year male with poorly controlled T2DM (A1c 8.9, not on insulin), dense peripheral neuropathy. He is currently admitted for left 4th toe cellulitis. He has noticed discoloration and tenderness to palpation over this digit for the past 5 days. He underwent left 1st MTP disarticulation of the great toe by Dr. Lajoyce Corners in May 2022. He denies drainage from the toe and has otherwise no symptoms. He is currently on IV Vancomycin with stable vitals on regular floor. There is a family history of diabetes with multiple amputations.   Past Medical History: Past Medical History:  Diagnosis Date   COVID-19 2020   Diabetes mellitus without complication (HCC)    type 2   Dyspnea    wtih exertion   History of kidney stones    Peripheral neuropathy     Surgical History: Past Surgical History:  Procedure Laterality Date   AMPUTATION Left 04/29/2021   Procedure: LEFT GREAT TOE AMPUTATION;  Surgeon: [name], MD;  Location: [facility] OR;  Service: Orthopedics;  Laterality: Left;   MULTIPLE TOOTH EXTRACTIONS      Family History: Family History  Problem Relation Age of Onset   Diabetes Mother    Diabetes Father    Diabetes Brother     Social History: Social History   Socioeconomic History   Marital status: Single    Spouse name: Not on file   Number of children: Not on file   Years of education: Not on file   Highest education level: Not on file  Occupational History   Not on file  Tobacco Use   Smoking status: Never   Smokeless tobacco: Never  Vaping Use   Vaping Use: Never used  Substance and Sexual Activity   Alcohol use: Not Currently   Drug use: Never   Sexual activity: Not on file  Other Topics  Concern   Not on file  Social History Narrative   Not on file   Social Determinants of Health   Financial Resource Strain: Not on file  Food Insecurity: Not on file  Transportation Needs: Not on file  Physical Activity: Not on file  Stress: Not on file  Social Connections: Not on file  Intimate Partner Violence: Not on file    Allergies: Penicillins  Medications: I have reviewed the patient's current medications.  Vital Signs: Patient Vitals for the past 24 hrs:  BP Temp Temp src Pulse Resp SpO2  01/19/22 1656 (!) 163/94 98.5 F (36.9 C) Oral 91 19 98 %  01/19/22 1522 (!) 113/101 98.5 F (36.9 C) -- 96 20 97 %  01/19/22 1241 (!) 147/87 -- -- 82 20 100 %  01/19/22 1240 -- -- -- 85 20 100 %  01/19/22 1233 -- -- -- 83 -- 100 %  01/19/22 1232 -- -- -- 86 -- 100 %  01/19/22 1231 -- -- -- 90 -- 100 %  01/19/22 1230 -- -- -- 84 -- 99 %  01/19/22 1229 -- -- -- 80 -- 99 %  01/19/22 1228 -- -- -- 83 -- 99 %  01/19/22 1227 -- -- -- 80 -- 98 %  01/19/22 1226 -- -- -- 83 -- 100 %  01/19/22 1225 -- -- --  80 -- 98 %  °01/19/22 1224 -- -- -- 79 -- 98 %  °01/19/22 1223 -- -- -- 80 -- 99 %  °01/19/22 1222 -- -- -- 83 -- 98 %  °01/19/22 1221 -- -- -- 81 -- 99 %  °01/19/22 1220 -- -- -- 80 -- 97 %  °01/19/22 1219 -- -- -- 79 -- 99 %  °01/19/22 1218 -- -- -- 79 -- 99 %  °01/19/22 1217 -- -- -- 81 -- 99 %  °01/19/22 1216 -- -- -- 80 -- 99 %  °01/19/22 1215 -- -- -- 79 -- 100 %  °01/19/22 1214 -- -- -- 85 -- 99 %  °01/19/22 1213 -- -- -- 79 -- 99 %  °01/19/22 1212 -- -- -- 80 -- 99 %  °01/19/22 1211 -- -- -- 79 -- 99 %  °01/19/22 1210 -- -- -- 78 -- 99 %  °01/19/22 1209 -- -- -- 83 -- 99 %  °01/19/22 1208 -- -- -- 84 -- 99 %  °01/19/22 1207 -- -- -- 77 -- 100 %  °01/19/22 1206 -- -- -- 80 -- 100 %  °01/19/22 1205 -- -- -- 80 -- 100 %  °01/19/22 1204 -- -- -- 80 -- 100 %  °01/19/22 1203 -- -- -- 85 -- 100 %  °01/19/22 1202 -- -- -- 80 -- 99 %  °01/19/22 1201 -- -- -- 82 -- 99 %  °01/19/22 1200 -- --  -- 94 -- 99 %  °01/19/22 1159 -- -- -- 79 -- 99 %  °01/19/22 1158 -- -- -- 79 -- 100 %  °01/19/22 1157 -- -- -- 82 -- 99 %  °01/19/22 1156 -- -- -- 79 -- 99 %  °01/19/22 1155 -- -- -- 81 -- 100 %  °01/19/22 1154 -- -- -- 79 -- 99 %  °01/19/22 1153 -- -- -- 79 -- 99 %  °01/19/22 1152 -- -- -- 81 -- 99 %  °01/19/22 1151 -- -- -- 80 -- 100 %  °01/19/22 1150 -- -- -- 86 -- 100 %  °01/19/22 1149 -- -- -- 81 -- 99 %  °01/19/22 1148 -- -- -- 79 -- 99 %  °01/19/22 1147 -- -- -- 78 -- 100 %  °01/19/22 1146 -- -- -- 83 -- 98 %  °01/19/22 1145 -- -- -- 79 -- 98 %  °01/19/22 1144 -- -- -- 80 -- 98 %  °01/19/22 1143 -- -- -- 81 -- 98 %  °01/19/22 1142 -- -- -- 82 -- 99 %  °01/19/22 1141 -- -- -- 84 -- 98 %  °01/19/22 1140 -- -- -- 81 -- 97 %  °01/19/22 1139 -- -- -- 80 -- 97 %  °01/19/22 1138 -- -- -- 83 -- 98 %  °01/19/22 1137 -- -- -- 82 -- 98 %  °01/19/22 1136 -- -- -- 80 -- 98 %  °01/19/22 1135 -- -- -- 82 -- 98 %  °01/19/22 1134 -- -- -- 82 -- 98 %  °01/19/22 1133 -- -- -- 83 -- 98 %  °01/19/22 1132 -- -- -- 80 -- 98 %  °01/19/22 1131 -- -- -- 84 -- 99 %  °01/19/22 1130 -- -- -- 82 -- 98 %  °01/19/22 1129 -- -- -- 81 -- 98 %  °01/19/22 1128 -- -- -- 82 -- 98 %  °01/19/22 1127 -- -- -- 80 -- 99 %  °01/19/22 1126 -- -- -- 82 -- 99 %  °01/19/22 1125 -- -- --   78 -- 98 %  °01/19/22 1124 -- -- -- 80 -- 98 %  °01/19/22 1123 -- -- -- 79 -- 98 %  °01/19/22 1122 -- -- -- 78 -- 98 %  °01/19/22 1121 -- -- -- 81 -- 98 %  °01/19/22 1120 -- -- -- 80 -- 98 %  °01/19/22 1119 -- -- -- 87 -- 97 %  °01/19/22 1118 -- -- -- 82 -- 97 %  °01/19/22 1117 -- -- -- 81 -- 99 %  °01/19/22 1116 -- -- -- 82 -- 96 %  °01/19/22 1115 -- -- -- 81 -- 97 %  °01/19/22 1114 -- -- -- 86 -- 97 %  °01/19/22 1113 -- -- -- 91 -- 97 %  °01/19/22 1112 -- -- -- 89 -- 97 %  °01/19/22 1111 -- -- -- 84 -- 97 %  °01/19/22 1110 -- -- -- 85 -- 97 %  °01/19/22 1109 -- -- -- 83 -- 97 %  °01/19/22 1108 -- -- -- 89 -- 97 %  °01/19/22 1107 -- -- -- 87 -- 97 %  °01/19/22 1106 -- --  -- 85 -- 98 %  °01/19/22 1105 -- -- -- 89 -- 97 %  °01/19/22 1104 -- -- -- 86 -- 97 %  °01/19/22 1103 -- -- -- 89 -- 97 %  °01/19/22 1102 -- -- -- 86 -- 97 %  °01/19/22 1101 -- -- -- 83 -- 96 %  °01/19/22 1100 -- -- -- 85 -- 98 %  °01/19/22 1059 -- -- -- 94 -- 97 %  °01/19/22 1058 -- -- -- 86 -- 97 %  °01/19/22 1057 -- -- -- 84 -- 96 %  °01/19/22 1056 -- -- -- 85 -- 96 %  °01/19/22 1055 -- -- -- 81 -- 96 %  °01/19/22 1054 -- -- -- 81 -- 97 %  °01/19/22 1053 -- -- -- 84 -- 96 %  °01/19/22 1052 -- -- -- 82 -- 96 %  °01/19/22 1051 -- -- -- 83 -- 98 %  °01/19/22 1050 -- -- -- 87 -- 96 %  °01/19/22 1049 -- -- -- 85 -- 95 %  °01/19/22 1048 -- -- -- 84 -- 100 %  °01/19/22 1047 -- -- -- 83 -- 98 %  °01/19/22 1046 -- -- -- 85 -- 96 %  °01/19/22 1045 -- -- -- 84 -- 96 %  °01/19/22 1044 -- -- -- 81 -- 96 %  °01/19/22 1043 -- -- -- 83 -- 97 %  °01/19/22 1042 -- -- -- 85 -- 98 %  °01/19/22 1041 -- -- -- 81 -- 97 %  °01/19/22 1040 -- -- -- 90 -- 97 %  °01/19/22 1039 -- -- -- 84 -- 96 %  °01/19/22 1038 -- -- -- 85 -- 96 %  °01/19/22 1037 -- -- -- 83 -- 96 %  °01/19/22 1036 -- -- -- 84 -- 97 %  °01/19/22 1035 -- -- -- 84 -- 97 %  °01/19/22 1034 -- -- -- 86 -- 96 %  °01/19/22 1033 -- -- -- 84 -- 96 %  °01/19/22 1032 -- -- -- 84 -- 97 %  °01/19/22 1031 -- -- -- 84 -- 97 %  °01/19/22 1030 -- -- -- 84 -- 97 %  °01/19/22 1029 -- -- -- 84 -- 97 %  °01/19/22 1028 -- -- -- 83 -- 96 %  °01/19/22 1027 -- -- -- 80 -- 97 %  °01/19/22 1026 -- -- -- 82 -- 97 %  °01/19/22 1025 -- -- --   82 -- 98 %  °01/19/22 1024 -- -- -- 80 -- 98 %  °01/19/22 1023 -- -- -- 81 -- 98 %  °01/19/22 1022 -- -- -- 80 -- 97 %  °01/19/22 1021 -- -- -- 83 -- 97 %  °01/19/22 1020 -- -- -- 82 -- 98 %  °01/19/22 1019 -- -- -- 82 -- 98 %  °01/19/22 1018 -- -- -- 86 -- 98 %  °01/19/22 1017 -- -- -- 83 -- 98 %  °01/19/22 1016 -- -- -- 85 -- 97 %  °01/19/22 1015 -- -- -- 85 -- 97 %  °01/19/22 1014 -- -- -- 85 -- 97 %  °01/19/22 1013 -- -- -- 84 -- 97 %  °01/19/22 1012 -- -- --  81 -- 97 %  °01/19/22 1011 -- -- -- 85 -- 97 %  °01/19/22 1010 -- -- -- 82 -- 97 %  °01/19/22 1009 -- -- -- 81 -- 97 %  °01/19/22 1008 -- -- -- 81 -- 98 %  °01/19/22 1007 -- -- -- 82 -- 97 %  °01/19/22 1006 -- -- -- 81 -- 98 %  °01/19/22 1005 -- -- -- 83 -- 97 %  °01/19/22 1004 -- -- -- 80 -- 98 %  °01/19/22 1003 -- -- -- 83 -- 97 %  °01/19/22 1002 -- -- -- 81 -- 98 %  °01/19/22 1001 -- -- -- 81 -- 98 %  °01/19/22 1000 138/78 -- -- 80 -- 98 %  °01/19/22 0959 -- -- -- 79 -- 98 %  °01/19/22 0958 -- -- -- 80 -- 98 %  °01/19/22 0957 -- -- -- 78 -- 98 %  °01/19/22 0956 -- -- -- 80 -- 97 %  °01/19/22 0955 -- -- -- 82 -- 98 %  °01/19/22 0954 -- -- -- 81 -- 98 %  °01/19/22 0953 -- -- -- 80 -- 97 %  °01/19/22 0952 -- -- -- 79 -- 97 %  °01/19/22 0951 -- -- -- 80 -- 97 %  °01/19/22 0950 -- -- -- 79 -- 97 %  °01/19/22 0949 -- -- -- 78 -- 97 %  °01/19/22 0948 -- -- -- 79 -- 98 %  °01/19/22 0947 -- -- -- 79 -- 98 %  °01/19/22 0946 -- -- -- 85 -- 98 %  °01/19/22 0936 (!) 133/99 -- -- 78 18 99 %  °01/19/22 0900 (!) 133/99 -- -- -- -- --  °01/19/22 0540 131/78 -- -- 78 18 99 %  °01/19/22 0215 -- -- -- 76 -- 98 %  °01/19/22 0200 (!) 147/87 -- -- 78 17 97 %  °01/19/22 0145 -- -- -- 80 -- 98 %  °01/19/22 0130 -- -- -- 84 -- 97 %  °01/19/22 0115 -- -- -- 81 -- 97 %  °01/19/22 0100 -- -- -- 79 -- 98 %  °01/19/22 0045 -- -- -- 79 -- 97 %  °01/19/22 0030 -- -- -- 81 -- 99 %  °01/19/22 0015 -- -- -- 79 -- 99 %  °01/19/22 0000 139/84 -- -- 81 -- 97 %  °01/18/22 2345 -- -- -- 87 -- 98 %  °01/18/22 2330 -- -- -- 81 -- 97 %  °01/18/22 2315 -- -- -- 84 -- 94 %  °01/18/22 2300 -- -- -- 80 -- 96 %  °01/18/22 2245 -- -- -- 81 -- 96 %  °01/18/22 2230 -- -- -- 86 -- 97 %  °01/18/22 2215 -- -- -- 86 -- 97 %  °01/18/22 2200   123/86 -- -- 87 16 97 %  01/18/22 2000 (!) 146/85 -- -- 86 16 98 %  01/18/22 1949 (!) 154/84 -- -- 88 16 99 %    Radiology: US ARTERIAL ABI (SCREENING LOWER EXTREMITY)  Result Date: 01/19/2022 CLINICAL DATA:  Neuropathy,  limited feelings in legs EXAM: NONINVASIVE PHYSIOLOGIC VASCULAR STUDY OF BILATERAL LOWER EXTREMITIES TECHNIQUE: Evaluation of both lower extremities were performed at rest, including calculation of ankle-brachial indices with single level Doppler, pressure and pulse volume recording. COMPARISON:  None. FINDINGS: Right ABI:  1.5 Left ABI:  1.5 Right Lower Extremity: Normal arterial waveforms at the ankle. The anterior and posterior tibial arteries are patent. Left Lower Extremity: Normal arterial waveforms at the ankle. The anterior and posterior tibial arteries are patent. > 1.4 Non diagnostic secondary to incompressible vessel calcifications (medial arterial sclerosis of [place]) IMPRESSION: Bilateral ABIs (>1.4) are nondiagnostic secondary to incompressible vessels calcifications. The bilateral anterior and posterior tibial arteries are patent with normal waveforms. Electronically Signed   By: Olive BassYasser  El-Abd M.D.   On: 01/19/2022 09:40   DG Foot Complete Left  Result Date: 01/18/2022 CLINICAL DATA:  Pain.  Wound on third toe. EXAM: LEFT FOOT - COMPLETE 3+ VIEW COMPARISON:  Left foot radiographs 08/25/2021 FINDINGS: Large calcaneal heel spur. Mild chronic spurring at the Achilles insertion on the calcaneus, unchanged. Appearance severe navicular-lateral cuneiform > navicular-cuboid and intermediate cuneiform joint space narrowing with unchanged dorsal bone hypertrophy at the level of the navicular on lateral view, likely degenerative. Postsurgical changes are again seen of amputation of the great toe phalanges. Reportedly there is a left third toe wound. There is mild erosion of the distal medial aspect of the proximal phalanx of the third toe with possible punctate erosion of the adjacent medial base of the middle phalanx. There is ill-defined mineralization that appears mildly separated from the distal medial aspect of the proximal phalanx. Findings are suspicious for osteomyelitis. Previously there was  mild curvilinear mineralization medial to the distal aspect of the proximal phalanx without definite bone erosion seen. 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 IMPRESSION:: IMPRESSION: 1. Cortical erosion suspicious for osteomyelitis of the distal medial aspect of the proximal phalanx and possibly minimally within the adjacent middle phalanx of the third toe. 2. Additional chronic findings as above. Electronically Signed   By: Neita Garnetonald  Viola M.D.   On: 01/18/2022 14:26    Labs: Recent Labs    01/18/22 1423 01/19/22 0357  WBC 9.5 8.7  RBC 5.32 5.06  HCT 46.1 44.3  PLT 226 208   Recent Labs    01/18/22 1423 01/19/22 0357  NA 136 131*  K 4.1 3.5  CL 101 101  CO2 26 24  BUN 13 15  CREATININE 0.82 0.82  GLUCOSE 156* 140*  CALCIUM 8.8* 8.1*   No results for input(s): LABPT, INR in the last 72 hours.  Review of Systems: ROS as detailed in HPI  Physical Exam: Body mass index is 32.1 kg/m.  Gen: AAOx3, NAD  Left lower extremity: Well healed scar at prior 1st MTP disarticulation stump site Mild erythema of 4th lesser toe with superficial ulceration present at distal tip - no drainage and does not probe deep on exam He has pinpoint superficial ulceration over 3rd webspace and anterior ankle without any drainage or surrounding erythema (baseline per patient for years) Mild TTP over 4th toe Wiggles toes APF, ADF 5/5 Diffuse numbness over toes and plantar (baseline per patient for  years) DP and PT 2+ CR<2s   Assessment and Plan: 46 year male with poorly controlled T2DM (A1c 8.9, not on insulin), dense peripheral neuropathy. He is currently admitted for left 4th toe cellulitis x 4 days.  -MRI wwo contrast of the left foot STAT to assess underlying osteomyelitis/abscess of the digit/foot for potential preoperative planning -will keep NPO and hold VTE prophylaxis from midnight for possible surgery Friday  1/27 or Saturday 1/28 pending advanced imaging -discussed with patient that if the MRI is not concerning for deep infection, we can trial antibiotic therapy with local wound care first. If that were to fail, we can consider amputation likely in the form of left foot 4th toe disarticulation at the MTP joint vs resection through the proximal phalanx of this digit -he is currently completely stable on the floor and blood cultures are in process -recommend patient optimize glycemic control for long term control of his diabetes, potentially with initiation of insulin therapy if indicated -recommend ID consult as patient will likely need outpatient antibiotic therapy -case reviewed with primary team who will work on obtaining MRI tonight  Netta Cedars, MD Orthopaedic Surgeon [facility] [phone]

## 2022-01-19 NOTE — Progress Notes (Signed)
PROGRESS NOTE     John Church, is a 46 y.o. male, DOB - April 14, 1976, [id number]  Admit date - 01/18/2022   Admitting Physician Bethena Roys, MD  Outpatient Primary MD for the patient is Lavella Lemons, [place]  LOS - 1  Chief Complaint  Patient presents with   Wound Infection        Brief Narrative:  46 y.o. male with medical history significant for diabetes mellitus, osteomyelitis and amputation of left foot great toe in May 2022 admitted on 01/19/22 with left third toe cellulitis and osteomyelitis  Assessment & Plan:   Principal Problem:   Toe osteomyelitis, left ([place]) Active Problems:   DM (diabetes mellitus) ([place])   Amputated toe of left foot ([place])   1)Left third toe cellulitis/osteomyelitis-- -Clinical exam and x-rays suggest osteomyelitis -Status post amputation of left big toe  in May 2022 -Bilateral ABIs (>1.4) are nondiagnostic secondary to incompressible vessels calcifications. -The bilateral anterior and posterior tibial arteries are patent with normal waveforms. -CRP 1.0, Sed rate 22, WBC 8.7 -I called and Discussed with orthopedic surgeon Dr Armond Hang -Left foot MRI pending Possible OR/orthopedic intervention in 1 to 2 days pending MRI findings -N.p.o. after midnight -Pharmacy consult for vancomycin -Consider ID consult   2)DM2-A1c 8.2 reflecting uncontrolled DM with hyperglycemia PTA -Hold metformin, Onglyza and glipizide as patient will be n.p.o. for possible surgery Use Novolog/Humalog Sliding scale insulin with Accu-Cheks/Fingersticks as ordered   3)HLD--continue Crestor  4)HTN--PTA patient was not on antihypertensive agents,  --may use IV labetalol when necessary  Every 4 hours for systolic blood pressure over 170 mmhg  5)Class II Obesity- -Low calorie diet, portion control and increase physical activity discussed with patient -Body mass index is 32.1 kg/m.  6) hyponatremia/dehydration--- IV fluids as ordered, repeat BMP in  a.m.  Disposition/Need for in-Hospital Stay- patient unable to be discharged at this time due to --osteomyelitis requiring IV antibiotics and possible operative intervention   Status is: Inpatient  Remains inpatient appropriate because: Please see above  Disposition: The patient is from: Home              Anticipated d/c is to: Home              Anticipated d/c date is: > 3 days              Patient currently is not medically stable to d/c. Barriers: Not Clinically Stable-   Code Status :  -  Code Status: Full Code   Family Communication:    NA (patient is alert, awake and coherent)   Consults  :  -I called and Discussed with orthopedic surgeon Dr Armond Hang  DVT Prophylaxis  :   - SCDs  SCDs Start: 01/18/22 2052    Lab Results  Component Value Date   PLT 208 01/19/2022    Inpatient Medications  Scheduled Meds:  insulin aspart  0-9 Units Subcutaneous Q6H   rosuvastatin  5 mg Oral Daily   Continuous Infusions:  sodium chloride 150 mL/hr at 01/19/22 0933   vancomycin     PRN Meds:.acetaminophen **OR** acetaminophen, ondansetron **OR** ondansetron (ZOFRAN) IV, polyethylene glycol   Anti-infectives (From admission, onward)    Start     Dose/Rate Route Frequency Ordered Stop   01/19/22 2200  vancomycin (VANCOREADY) IVPB 1500 mg/300 mL        1,500 mg 150 mL/hr over 120 Minutes Intravenous Every 12 hours 01/19/22 1123     01/19/22 1000  vancomycin (VANCOREADY)  IVPB 2000 mg/400 mL        2,000 mg 200 mL/hr over 120 Minutes Intravenous  Once 01/19/22 0929 01/19/22 1209   01/18/22 1530  vancomycin (VANCOCIN) IVPB 1000 mg/200 mL premix        1,000 mg 200 mL/hr over 60 Minutes Intravenous  Once 01/18/22 1527 01/18/22 1644         Subjective: John Church today has no fevers, no emesis,  No chest pain,   - Tolerating oral intake well, no new concerns   Objective: Vitals:   01/19/22 1240 01/19/22 1241 01/19/22 1522 01/19/22 1656  BP:  (!) 147/87 (!)  113/101 (!) 163/94  Pulse: 85 82 96 91  Resp: 20 20 20 19   Temp:   98.5 F (36.9 C) 98.5 F (36.9 C)  TempSrc:    Oral  SpO2: 100% 100% 97% 98%  Weight:      Height:        Intake/Output Summary (Last 24 hours) at 01/19/2022 1657 Last data filed at 01/19/2022 1252 Gross per 24 hour  Intake 1400 ml  Output --  Net 1400 ml   Filed Weights   01/18/22 1209  Weight: 113.4 kg     Physical Exam  Gen:- Awake Alert,  in no apparent distress  HEENT:- [place].AT, No sclera icterus Neck-Supple Neck,No JVD,.  Lungs-  CTAB , fair symmetrical air movement\ CV- S1, S2 normal, regular  Abd-  +ve B.Sounds, Abd Soft, No tenderness,    Psych-affect is appropriate, oriented x3 Neuro-no new focal deficits, no tremors Extremity/Skin:-   pedal pulses present  -Please see photo below   Media Information Document Information  Photos    01/18/2022 16:32  Attached To:  Hospital Encounter on 01/18/22   Source Information  Bethena Roys, MD   [facility]    Data Reviewed: I have personally reviewed following labs and imaging studies  CBC: Recent Labs  Lab 01/18/22 1423 01/19/22 0357  WBC 9.5 8.7  NEUTROABS 6.3  --   HGB 16.0 15.0  HCT 46.1 44.3  MCV 86.7 87.5  PLT 226 [id number]   Basic Metabolic Panel: Recent Labs  Lab 01/18/22 1423 01/19/22 0357  NA 136 131*  K 4.1 3.5  CL 101 101  CO2 26 24  GLUCOSE 156* 140*  BUN 13 15  CREATININE 0.82 0.82  CALCIUM 8.8* 8.1*   GFR: Estimated Creatinine Clearance: 152.4 mL/min (by C-G formula based on SCr of 0.82 mg/dL). Liver Function Tests: No results for input(s): AST, ALT, ALKPHOS, BILITOT, PROT, ALBUMIN in the last 168 hours. No results for input(s): LIPASE, AMYLASE in the last 168 hours. No results for input(s): AMMONIA in the last 168 hours. Coagulation Profile: No results for input(s): INR, PROTIME in the last 168 hours. Cardiac Enzymes: No results for input(s): CKTOTAL, CKMB, CKMBINDEX, TROPONINI in the last 168  hours. BNP (last 3 results) No results for input(s): PROBNP in the last 8760 hours. HbA1C: Recent Labs    01/18/22 1757  HGBA1C 8.2*   CBG: Recent Labs  Lab 01/18/22 1740 01/18/22 2333 01/19/22 0535 01/19/22 1240  GLUCAP 172* 167* 134* 142*   Lipid Profile: No results for input(s): CHOL, HDL, LDLCALC, TRIG, CHOLHDL, LDLDIRECT in the last 72 hours. Thyroid Function Tests: No results for input(s): TSH, T4TOTAL, FREET4, T3FREE, THYROIDAB in the last 72 hours. Anemia Panel: No results for input(s): VITAMINB12, FOLATE, FERRITIN, TIBC, IRON, RETICCTPCT in the last 72 hours. Urine analysis:    Component Value Date/Time  COLORURINE YELLOW 12/01/2019 [place] 12/01/2019 1717   LABSPEC 1.032 (H) 12/01/2019 1717   PHURINE 6.0 12/01/2019 1717   GLUCOSEU >=500 (A) 12/01/2019 1717   HGBUR SMALL (A) 12/01/2019 1717   BILIRUBINUR NEGATIVE 12/01/2019 1717   KETONESUR 80 (A) 12/01/2019 1717   PROTEINUR 30 (A) 12/01/2019 1717   UROBILINOGEN 0.2 06/23/2009 0651   NITRITE NEGATIVE 12/01/2019 1717   LEUKOCYTESUR NEGATIVE 12/01/2019 1717   Sepsis Labs: @LABRCNTIP (procalcitonin:4,lacticidven:4)  ) Recent Results (from the past 240 hour(s))  Resp Panel by RT-PCR (Flu A&B, Covid) Nasopharyngeal Swab     Status: None   Collection Time: 01/18/22  3:25 PM   Specimen: Nasopharyngeal Swab; Nasopharyngeal(NP) swabs in vial transport medium  Result Value Ref Range Status   SARS Coronavirus 2 by RT PCR NEGATIVE NEGATIVE Final    Comment: (NOTE) SARS-CoV-2 target nucleic acids are NOT DETECTED.  The SARS-CoV-2 RNA is generally detectable in upper respiratory specimens during the acute phase of infection. The lowest concentration of SARS-CoV-2 viral copies this assay can detect is 138 copies/mL. A negative result does not preclude SARS-Cov-2 infection and should not be used as the sole basis for treatment or other patient management decisions. A negative result may occur with   improper specimen collection/handling, submission of specimen other than nasopharyngeal swab, presence of viral mutation(s) within the areas targeted by this assay, and inadequate number of viral copies(<138 copies/mL). A negative result Church be combined with clinical observations, patient history, and epidemiological information. The expected result is Negative.  Fact Sheet for Patients:  EntrepreneurPulse.com.au  Fact Sheet for Healthcare Providers:  IncredibleEmployment.be  This test is no t yet approved or cleared by the [place] FDA and  has been authorized for detection and/or diagnosis of SARS-CoV-2 by FDA under an Emergency Use Authorization (EUA). This EUA will remain  in effect (meaning this test can be used) for the duration of the COVID-19 declaration under Section 564(b)(1) of the Act, 21 U.S.C.section 360bbb-3(b)(1), unless the authorization is terminated  or revoked sooner.       Influenza A by PCR NEGATIVE NEGATIVE Final   Influenza B by PCR NEGATIVE NEGATIVE Final    Comment: (NOTE) The Xpert Xpress SARS-CoV-2/FLU/RSV plus assay is intended as an aid in the diagnosis of influenza from Nasopharyngeal swab specimens and should not be used as a sole basis for treatment. Nasal washings and aspirates are unacceptable for Xpert Xpress SARS-CoV-2/FLU/RSV testing.  Fact Sheet for Patients: EntrepreneurPulse.com.au  Fact Sheet for Healthcare Providers: IncredibleEmployment.be  This test is not yet approved or cleared by the [place] FDA and has been authorized for detection and/or diagnosis of SARS-CoV-2 by FDA under an Emergency Use Authorization (EUA). This EUA will remain in effect (meaning this test can be used) for the duration of the COVID-19 declaration under Section 564(b)(1) of the Act, 21 U.S.C. section 360bbb-3(b)(1), unless the authorization is terminated  or revoked.  Performed at [facility], [address], [place], [place] [address]   Culture, blood (routine x 2)     Status: None (Preliminary result)   Collection Time: 01/18/22  5:57 PM   Specimen: BLOOD RIGHT ARM  Result Value Ref Range Status   Specimen Description BLOOD RIGHT ARM  Final   Special Requests   Final    BOTTLES DRAWN AEROBIC AND ANAEROBIC Blood Culture adequate volume   Culture   Final    NO GROWTH < 24 HOURS Performed at [facility], [address] [place], [place]  [address]    Report Status PENDING  Incomplete  Culture, blood (routine x 2)     Status: None (Preliminary result)   Collection Time: 01/18/22  5:57 PM   Specimen: BLOOD LEFT ARM  Result Value Ref Range Status   Specimen Description BLOOD LEFT ARM  Final   Special Requests   Final    Blood Culture adequate volume BOTTLES DRAWN AEROBIC AND ANAEROBIC   Culture   Final    NO GROWTH < 24 HOURS Performed at [facility], [address], [place], [place] [address]    Report Status PENDING  Incomplete      Radiology Studies: US ARTERIAL ABI (SCREENING LOWER EXTREMITY)  Result Date: 01/19/2022 CLINICAL DATA:  Neuropathy, limited feelings in legs EXAM: NONINVASIVE PHYSIOLOGIC VASCULAR STUDY OF BILATERAL LOWER EXTREMITIES TECHNIQUE: Evaluation of both lower extremities were performed at rest, including calculation of ankle-brachial indices with single level Doppler, pressure and pulse volume recording. COMPARISON:  None. FINDINGS: Right ABI:  1.5 Left ABI:  1.5 Right Lower Extremity: Normal arterial waveforms at the ankle. The anterior and posterior tibial arteries are patent. Left Lower Extremity: Normal arterial waveforms at the ankle. The anterior and posterior tibial arteries are patent. > 1.4 Non diagnostic secondary to incompressible vessel calcifications (medial arterial sclerosis of [place]) IMPRESSION: Bilateral ABIs (>1.4) are nondiagnostic secondary to incompressible vessels calcifications. The  bilateral anterior and posterior tibial arteries are patent with normal waveforms. Electronically Signed   By: Albin Felling M.D.   On: 01/19/2022 09:40   DG Foot Complete Left  Result Date: 01/18/2022 CLINICAL DATA:  Pain.  Wound on third toe. EXAM: LEFT FOOT - COMPLETE 3+ VIEW COMPARISON:  Left foot radiographs 08/25/2021 FINDINGS: Large calcaneal heel spur. Mild chronic spurring at the Achilles insertion on the calcaneus, unchanged. Appearance severe navicular-lateral cuneiform > navicular-cuboid and intermediate cuneiform joint space narrowing with unchanged dorsal bone hypertrophy at the level of the navicular on lateral view, likely degenerative. Postsurgical changes are again seen of amputation of the great toe phalanges. Reportedly there is a left third toe wound. There is mild erosion of the distal medial aspect of the proximal phalanx of the third toe with possible punctate erosion of the adjacent medial base of the middle phalanx. There is ill-defined mineralization that appears mildly separated from the distal medial aspect of the proximal phalanx. Findings are suspicious for osteomyelitis. Previously there was mild curvilinear mineralization medial to the distal aspect of the proximal phalanx without definite bone erosion seen. 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 IMPRESSION:: IMPRESSION: 1. Cortical erosion suspicious for osteomyelitis of the distal medial aspect of the proximal phalanx and possibly minimally within the adjacent middle phalanx of the third toe. 2. Additional chronic findings as above. Electronically Signed   By: Yvonne Kendall M.D.   On: 01/18/2022 14:26     Scheduled Meds:  insulin aspart  0-9 Units Subcutaneous Q6H   rosuvastatin  5 mg Oral Daily   Continuous Infusions:  sodium chloride 150 mL/hr at 01/19/22 0933   vancomycin       LOS: 1 day    Roxan Hockey M.D on 01/19/2022 at  4:57 PM  Go to [email or web address] - for contact info  [facility] - Office  [phone]  If 7PM-7AM, please contact night-coverage [email or web address] Password [facility] 01/19/2022, 4:57 PM

## 2022-01-20 ENCOUNTER — Other Ambulatory Visit (HOSPITAL_COMMUNITY): Payer: Self-pay

## 2022-01-20 DIAGNOSIS — E1142 Type 2 diabetes mellitus with diabetic polyneuropathy: Secondary | ICD-10-CM | POA: Diagnosis not present

## 2022-01-20 DIAGNOSIS — M86172 Other acute osteomyelitis, left ankle and foot: Secondary | ICD-10-CM | POA: Diagnosis not present

## 2022-01-20 DIAGNOSIS — E1169 Type 2 diabetes mellitus with other specified complication: Principal | ICD-10-CM

## 2022-01-20 LAB — CBC
HCT: 41.8 % (ref 39.0–52.0)
Hemoglobin: 14.5 g/dL (ref 13.0–17.0)
MCH: 29.5 pg (ref 26.0–34.0)
MCHC: 34.7 g/dL (ref 30.0–36.0)
MCV: 85.1 fL (ref 80.0–100.0)
Platelets: 207 10*3/uL (ref 150–400)
RBC: 4.91 MIL/uL (ref 4.22–5.81)
RDW: 11.9 % (ref 11.5–15.5)
WBC: 8.3 10*3/uL (ref 4.0–10.5)
nRBC: 0 % (ref 0.0–0.2)

## 2022-01-20 LAB — BASIC METABOLIC PANEL
Anion gap: 8 (ref 5–15)
BUN: 10 mg/dL (ref 6–20)
CO2: 24 mmol/L (ref 22–32)
Calcium: 8.5 mg/dL — ABNORMAL LOW (ref 8.9–10.3)
Chloride: 104 mmol/L (ref 98–111)
Creatinine, Ser: 0.79 mg/dL (ref 0.61–1.24)
GFR, Estimated: >60 mL/min (ref >60)
Glucose, Bld: 174 mg/dL — ABNORMAL HIGH (ref 70–99)
Potassium: 3.8 mmol/L (ref 3.5–5.1)
Sodium: 136 mmol/L (ref 135–145)

## 2022-01-20 LAB — GLUCOSE, CAPILLARY
Glucose-Capillary: 170 mg/dL — ABNORMAL HIGH (ref 70–99)
Glucose-Capillary: 222 mg/dL — ABNORMAL HIGH (ref 70–99)
Glucose-Capillary: 223 mg/dL — ABNORMAL HIGH (ref 70–99)

## 2022-01-20 MED ORDER — SULFAMETHOXAZOLE-TRIMETHOPRIM 800-160 MG PO TABS
1.0000 | ORAL_TABLET | Freq: Two times a day (BID) | ORAL | Status: DC
  Filled 2022-01-20: qty 1

## 2022-01-20 MED ORDER — AMOXICILLIN-POT CLAVULANATE 500-125 MG PO TABS: 1.0000 | ORAL_TABLET | Freq: Three times a day (TID) | ORAL | 1 refills | Status: DC

## 2022-01-20 MED ORDER — AMOXICILLIN-POT CLAVULANATE 500-125 MG PO TABS
1.0000 | ORAL_TABLET | Freq: Three times a day (TID) | ORAL | Status: DC
  Administered 2022-01-20 (×2): 500 mg via ORAL
  Filled 2022-01-20 (×3): qty 1

## 2022-01-20 MED ORDER — SULFAMETHOXAZOLE-TRIMETHOPRIM 800-160 MG PO TABS: 1.0000 | ORAL_TABLET | Freq: Two times a day (BID) | ORAL | 0 refills | Status: DC

## 2022-01-20 NOTE — Consult Note (Signed)
[facility]  Total days of antibiotics 3/vanco               Reason for Consult: diabetic foot ulcer/osteomyelitis of left leg  Referring Physician: vann  Principal Problem:   Toe osteomyelitis, left ([place]) Active Problems:   DM (diabetes mellitus) ([place])   Amputated toe of left foot (HCC)    HPI: John Church is a 46 y.o. male with history of T2DM c/b peripheral neuropathy who has hx of left great toe amputation in 2022 who has been wearing compression stocking to left leg for swelling however he started to notice pain and worsening discoloration to 4th toe of left foot. He also has noticed blood sugars being labile which he attributes to holidays. Due to worsening discoloration, cellulitis, he went to [facility] ED for evaluation. He was started on vancomycin for cellulitis and transferred to [facility] for evaluation by orthopedics. His WBC 8.7, sed rate of 22. MRI did show distal phalynx of 4th digit having signs of osteo but also 3rd digit PIP septic arthritis and cellulitis changes. He states that the erythema of his left foot has improved. He was seen by surgery who feel that he could do medical management  Hx of pcn allergy as a child where he required evaluation at hospital. No further details. He recently finished a course of bactrim without difficulty for recurrent cyst on his back.  Past Medical History:  Diagnosis Date   COVID-19 2020   Diabetes mellitus without complication ([place])    type 2   Dyspnea    wtih exertion   History of kidney stones    Peripheral neuropathy     Allergies:  Allergies  Allergen Reactions   Penicillins     Did it involve swelling of the face/tongue/throat, SOB, or low BP? Unknown Did it involve sudden or severe rash/hives, skin peeling, or any reaction on the inside of your mouth or nose? Unknown Did you need to seek medical attention at a hospital or doctor's office? Unknown When did it last happen? 46 year old       If all  above answers are "NO", may proceed with cephalosporin use.   CHILDHOOD ALLERGY    MEDICATIONS:  amoxicillin-clavulanate  1 tablet Oral TID   influenza vac split quadrivalent PF  0.5 mL Intramuscular Tomorrow-1000   insulin aspart  0-9 Units Subcutaneous Q6H   rosuvastatin  5 mg Oral Daily   sulfamethoxazole-trimethoprim  1 tablet Oral Q12H    Social History   Tobacco Use   Smoking status: Never   Smokeless tobacco: Never  Vaping Use   Vaping Use: Never used  Substance Use Topics   Alcohol use: Not Currently   Drug use: Never    Family History  Problem Relation Age of Onset   Diabetes Mother    Diabetes Father    Diabetes Brother     Review of Systems  Constitutional: Negative for fever, chills, diaphoresis, activity change, appetite change, fatigue and unexpected weight change.  HENT: Negative for congestion, sore throat, rhinorrhea, sneezing, trouble swallowing and sinus pressure.  Eyes: Negative for photophobia and visual disturbance.  Respiratory: Negative for cough, chest tightness, shortness of breath, wheezing and stridor.  Cardiovascular: Negative for chest pain, palpitations and leg swelling.  Gastrointestinal: Negative for nausea, vomiting, abdominal pain, diarrhea, constipation, blood in stool, abdominal distention and anal bleeding.  Genitourinary: Negative for dysuria, hematuria, flank pain and difficulty urinating.  Musculoskeletal: Negative for myalgias, back  pain, joint swelling, arthralgias and gait problem.  Skin: Negative for color change, pallor, rash and wound.  Neurological: +neuropathy to lower extremities. Negative for dizziness, tremors, weakness and light-headedness.  Hematological: Negative for adenopathy. Does not bruise/bleed easily.  Psychiatric/Behavioral: Negative for behavioral problems, confusion, sleep disturbance, dysphoric mood, decreased concentration and agitation.   OBJECTIVE: Temp:  [98.3 F (36.8 C)-98.5 F (36.9 C)] 98.3 F  (36.8 C) (01/27 0824) Pulse Rate:  [77-96] 77 (01/27 0824) Resp:  [18-20] 18 (01/27 0824) BP: (113-163)/(73-101) 136/73 (01/27 0824) SpO2:  [97 %-98 %] 98 % (01/27 0824) Physical Exam  Constitutional: He is oriented to person, place, and time. He appears well-developed and well-nourished. No distress.  HENT:  Mouth/Throat: Oropharynx is clear and moist. No oropharyngeal exudate.  Cardiovascular: Normal rate, regular rhythm and normal heart sounds. Exam reveals no gallop and no friction rub.  No murmur heard.  Pulmonary/Chest: Effort normal and breath sounds normal. No respiratory distress. He has no wheezes.  Abdominal: Soft. Bowel sounds are normal. He exhibits no distension. There is no tenderness.  Ext: left foot, missing great toe. 4th digit has distal eschar. 3rd toe no erythema but some tenderness at PIP joint Neurological: He is alert and oriented to person, place, and time.  Skin: Skin is warm and dry. No rash noted. No erythema.  Psychiatric: He has a normal mood and affect. His behavior is normal.    LABS: Results for orders placed or performed during the hospital encounter of 01/18/22 (from the past 48 hour(s))  CBC with Differential/Platelet     Status: None   Collection Time: 01/18/22  2:23 PM  Result Value Ref Range   WBC 9.5 4.0 - 10.5 K/uL   RBC 5.32 4.22 - 5.81 MIL/uL   Hemoglobin 16.0 13.0 - 17.0 g/dL   HCT 46.1 39.0 - 52.0 %   MCV 86.7 80.0 - 100.0 fL   MCH 30.1 26.0 - 34.0 pg   MCHC 34.7 30.0 - 36.0 g/dL   RDW 12.0 11.5 - 15.5 %   Platelets 226 150 - 400 K/uL   nRBC 0.0 0.0 - 0.2 %   Neutrophils Relative % 66 %   Neutro Abs 6.3 1.7 - 7.7 K/uL   Lymphocytes Relative 23 %   Lymphs Abs 2.2 0.7 - 4.0 K/uL   Monocytes Relative 6 %   Monocytes Absolute 0.6 0.1 - 1.0 K/uL   Eosinophils Relative 4 %   Eosinophils Absolute 0.4 0.0 - 0.5 K/uL   Basophils Relative 1 %   Basophils Absolute 0.1 0.0 - 0.1 K/uL   Immature Granulocytes 0 %   Abs Immature Granulocytes  0.04 0.00 - 0.07 K/uL    Comment: Performed at [facility], [address], [place], [place] [id number]  Basic metabolic panel     Status: Abnormal   Collection Time: 01/18/22  2:23 PM  Result Value Ref Range   Sodium 136 135 - 145 mmol/L   Potassium 4.1 3.5 - 5.1 mmol/L   Chloride 101 98 - 111 mmol/L   CO2 26 22 - 32 mmol/L   Glucose, Bld 156 (H) 70 - 99 mg/dL    Comment: Glucose reference range applies only to samples taken after fasting for at least 8 hours.   BUN 13 6 - 20 mg/dL   Creatinine, Ser 0.82 0.61 - 1.24 mg/dL   Calcium 8.8 (L) 8.9 - 10.3 mg/dL   GFR, Estimated >60 >60 mL/min    Comment: (NOTE) Calculated using the CKD-EPI Creatinine  Equation (2021)    Anion gap 9 5 - 15    Comment: Performed at [facility], [address], [place], [place] [address]  Resp Panel by RT-PCR (Flu A&B, Covid) Nasopharyngeal Swab     Status: None   Collection Time: 01/18/22  3:25 PM   Specimen: Nasopharyngeal Swab; Nasopharyngeal(NP) swabs in vial transport medium  Result Value Ref Range   SARS Coronavirus 2 by RT PCR NEGATIVE NEGATIVE    Comment: (NOTE) SARS-CoV-2 target nucleic acids are NOT DETECTED.  The SARS-CoV-2 RNA is generally detectable in upper respiratory specimens during the acute phase of infection. The lowest concentration of SARS-CoV-2 viral copies this assay can detect is 138 copies/mL. A negative result does not preclude SARS-Cov-2 infection and should not be used as the sole basis for treatment or other patient management decisions. A negative result may occur with  improper specimen collection/handling, submission of specimen other than nasopharyngeal swab, presence of viral mutation(s) within the areas targeted by this assay, and inadequate number of viral copies(<138 copies/mL). A negative result must be combined with clinical observations, patient history, and epidemiological information. The expected result is Negative.  Fact Sheet for Patients:   EntrepreneurPulse.com.au  Fact Sheet for Healthcare Providers:  IncredibleEmployment.be  This test is no t yet approved or cleared by the [place] FDA and  has been authorized for detection and/or diagnosis of SARS-CoV-2 by FDA under an Emergency Use Authorization (EUA). This EUA will remain  in effect (meaning this test can be used) for the duration of the COVID-19 declaration under Section 564(b)(1) of the Act, 21 U.S.C.section 360bbb-3(b)(1), unless the authorization is terminated  or revoked sooner.       Influenza A by PCR NEGATIVE NEGATIVE   Influenza B by PCR NEGATIVE NEGATIVE    Comment: (NOTE) The Xpert Xpress SARS-CoV-2/FLU/RSV plus assay is intended as an aid in the diagnosis of influenza from Nasopharyngeal swab specimens and should not be used as a sole basis for treatment. Nasal washings and aspirates are unacceptable for Xpert Xpress SARS-CoV-2/FLU/RSV testing.  Fact Sheet for Patients: EntrepreneurPulse.com.au  Fact Sheet for Healthcare Providers: IncredibleEmployment.be  This test is not yet approved or cleared by the [place] FDA and has been authorized for detection and/or diagnosis of SARS-CoV-2 by FDA under an Emergency Use Authorization (EUA). This EUA will remain in effect (meaning this test can be used) for the duration of the COVID-19 declaration under Section 564(b)(1) of the Act, 21 U.S.C. section 360bbb-3(b)(1), unless the authorization is terminated or revoked.  Performed at [facility], [address] [place], [place] [address]   CBG monitoring, ED     Status: Abnormal   Collection Time: 01/18/22  5:40 PM  Result Value Ref Range   Glucose-Capillary 172 (H) 70 - 99 mg/dL    Comment: Glucose reference range applies only to samples taken after fasting for at least 8 hours.  Sedimentation rate     Status: Abnormal   Collection Time: 01/18/22  5:57 PM  Result  Value Ref Range   Sed Rate 22 (H) 0 - 16 mm/hr    Comment: Performed at [facility], [address] [place], [place] [address]  C-reactive protein     Status: Abnormal   Collection Time: 01/18/22  5:57 PM  Result Value Ref Range   CRP 1.0 (H) <1.0 mg/dL    Comment: Performed at [facility] [address] [place], [place] [address]  Culture, blood (routine x 2)  Status: None (Preliminary result)   Collection Time: 01/18/22  5:57 PM   Specimen: BLOOD RIGHT ARM  Result Value Ref Range   Specimen Description BLOOD RIGHT ARM    Special Requests      BOTTLES DRAWN AEROBIC AND ANAEROBIC Blood Culture adequate volume   Culture      NO GROWTH 1 DAY Performed at [facility], [address] [place], [place] [address]    Report Status PENDING   Culture, blood (routine x 2)     Status: None (Preliminary result)   Collection Time: 01/18/22  5:57 PM   Specimen: BLOOD LEFT ARM  Result Value Ref Range   Specimen Description BLOOD LEFT ARM    Special Requests      Blood Culture adequate volume BOTTLES DRAWN AEROBIC AND ANAEROBIC   Culture      NO GROWTH 1 DAY Performed at [facility], [address], [place], [place] [address]    Report Status PENDING   Hemoglobin A1c     Status: Abnormal   Collection Time: 01/18/22  5:57 PM  Result Value Ref Range   Hgb A1c MFr Bld 8.2 (H) 4.8 - 5.6 %    Comment: (NOTE)         Prediabetes: 5.7 - 6.4         Diabetes: >6.4         Glycemic control for adults with diabetes: <7.0    Mean Plasma Glucose 189 mg/dL    Comment: (NOTE) Performed At: [facility] [place], [place] [id number] Rush Farmer MD [id number]   CBG monitoring, ED     Status: Abnormal   Collection Time: 01/18/22 11:33 PM  Result Value Ref Range   Glucose-Capillary 167 (H) 70 - 99 mg/dL    Comment: Glucose reference range applies only to samples taken after fasting for at least 8 hours.  HIV Antibody (routine testing w rflx)     Status: None    Collection Time: 01/19/22  3:57 AM  Result Value Ref Range   HIV Screen 4th Generation wRfx Non Reactive Non Reactive    Comment: Performed at [facility], [address] [place], [place] [id number]  Basic metabolic panel     Status: Abnormal   Collection Time: 01/19/22  3:57 AM  Result Value Ref Range   Sodium 131 (L) 135 - 145 mmol/L   Potassium 3.5 3.5 - 5.1 mmol/L   Chloride 101 98 - 111 mmol/L   CO2 24 22 - 32 mmol/L   Glucose, Bld 140 (H) 70 - 99 mg/dL    Comment: Glucose reference range applies only to samples taken after fasting for at least 8 hours.   BUN 15 6 - 20 mg/dL   Creatinine, Ser 0.82 0.61 - 1.24 mg/dL   Calcium 8.1 (L) 8.9 - 10.3 mg/dL   GFR, Estimated >60 >60 mL/min    Comment: (NOTE) Calculated using the CKD-EPI Creatinine Equation (2021)    Anion gap 6 5 - 15    Comment: Performed at [facility], [address], [place],  [address]  CBC     Status: None   Collection Time: 01/19/22  3:57 AM  Result Value Ref Range   WBC 8.7 4.0 - 10.5 K/uL   RBC 5.06 4.22 - 5.81 MIL/uL   Hemoglobin 15.0 13.0 - 17.0 g/dL   HCT 44.3 39.0 - 52.0 %   MCV 87.5 80.0 - 100.0 fL   MCH 29.6 26.0 - 34.0  pg   MCHC 33.9 30.0 - 36.0 g/dL   RDW 12.0 11.5 - 15.5 %   Platelets 208 150 - 400 K/uL   nRBC 0.0 0.0 - 0.2 %    Comment: Performed at [facility], [address] [place], [place] [address]  CBG monitoring, ED     Status: Abnormal   Collection Time: 01/19/22  5:35 AM  Result Value Ref Range   Glucose-Capillary 134 (H) 70 - 99 mg/dL    Comment: Glucose reference range applies only to samples taken after fasting for at least 8 hours.  CBG monitoring, ED     Status: Abnormal   Collection Time: 01/19/22 12:40 PM  Result Value Ref Range   Glucose-Capillary 142 (H) 70 - 99 mg/dL    Comment: Glucose reference range applies only to samples taken after fasting for at least 8 hours.  Glucose, capillary     Status: Abnormal   Collection Time: 01/19/22  6:02 PM  Result  Value Ref Range   Glucose-Capillary 191 (H) 70 - 99 mg/dL    Comment: Glucose reference range applies only to samples taken after fasting for at least 8 hours.  Glucose, capillary     Status: Abnormal   Collection Time: 01/19/22 11:46 PM  Result Value Ref Range   Glucose-Capillary 164 (H) 70 - 99 mg/dL    Comment: Glucose reference range applies only to samples taken after fasting for at least 8 hours.  CBC     Status: None   Collection Time: 01/20/22  2:07 AM  Result Value Ref Range   WBC 8.3 4.0 - 10.5 K/uL   RBC 4.91 4.22 - 5.81 MIL/uL   Hemoglobin 14.5 13.0 - 17.0 g/dL   HCT 41.8 39.0 - 52.0 %   MCV 85.1 80.0 - 100.0 fL   MCH 29.5 26.0 - 34.0 pg   MCHC 34.7 30.0 - 36.0 g/dL   RDW 11.9 11.5 - 15.5 %   Platelets 207 150 - 400 K/uL   nRBC 0.0 0.0 - 0.2 %    Comment: Performed at [facility], [place] [address] [place], [place] [id number]  Basic metabolic panel     Status: Abnormal   Collection Time: 01/20/22  2:07 AM  Result Value Ref Range   Sodium 136 135 - 145 mmol/L   Potassium 3.8 3.5 - 5.1 mmol/L   Chloride 104 98 - 111 mmol/L   CO2 24 22 - 32 mmol/L   Glucose, Bld 174 (H) 70 - 99 mg/dL    Comment: Glucose reference range applies only to samples taken after fasting for at least 8 hours.   BUN 10 6 - 20 mg/dL   Creatinine, Ser 0.79 0.61 - 1.24 mg/dL   Calcium 8.5 (L) 8.9 - 10.3 mg/dL   GFR, Estimated >60 >60 mL/min    Comment: (NOTE) Calculated using the CKD-EPI Creatinine Equation (2021)    Anion gap 8 5 - 15    Comment: Performed at [facility] [address] [place], [place] [address]  Glucose, capillary     Status: Abnormal   Collection Time: 01/20/22  6:02 AM  Result Value Ref Range   Glucose-Capillary 170 (H) 70 - 99 mg/dL    Comment: Glucose reference range applies only to samples taken after fasting for at least 8 hours.  Glucose, capillary     Status: Abnormal   Collection Time: 01/20/22 11:27 AM  Result Value Ref Range   Glucose-Capillary  222 (H) 70 -  99 mg/dL    Comment: Glucose reference range applies only to samples taken after fasting for at least 8 hours.    MICRO: reviewed IMAGING: MR FOOT LEFT W WO CONTRAST  Result Date: 01/20/2022 CLINICAL DATA:  Osteomyelitis of the left foot. Prior great toe amputation 04/29/2021. Diabetes. EXAM: MRI OF THE LEFT FOREFOOT WITHOUT AND WITH CONTRAST TECHNIQUE: Multiplanar, multisequence MR imaging of the left foot was performed both before and after administration of intravenous contrast. Osteomyelitis protocol MRI of the foot was obtained, to include the entire foot and ankle. This protocol uses a large field of view to cover the entire foot and ankle, and is suitable for assessing bony structures for osteomyelitis. Due to the large field of view and imaging plane choice, this protocol is less sensitive for assessing small structures such as ligamentous structures of the foot and ankle, compared to a dedicated forefoot or dedicated hindfoot exam. CONTRAST:  38mL GADAVIST GADOBUTROL 1 MMOL/ML IV SOLN COMPARISON:  Multiple exams, including radiographs from 01/18/2022 and 04/25/2021 FINDINGS: Bones/Joint/Cartilage Abnormal scalloped anterior margin of the lateral portion of the navicular subcortical low T2 signal with serpentine marginal T1 signal, appearance favors avascular necrosis and could have a subacute component. Similarly, there is abnormal marrow edema signal and enhancement in the head of the second metatarsal with subcortical hypoenhancement along the distal articular subcortical surface any manner suspicious for avascular necrosis, image 9 series 10. Small focus of edema signal and enhancement along the plantar margin of the third metatarsal head for example on image 20 series 8 and image 20 series 11, possibly degenerative but technically nonspecific. Abnormal hand enhancement in this vicinity measures about 0.6 by 0.3 cm and is notably less striking than in the second metatarsal head.  There is abnormal edema and enhancement in the distal phalanx of the fourth toe favoring osteomyelitis, with surrounding subcutaneous edema. In the third toe, which by report is relatively benign clinically, there is abnormal edema and enhancement in the distal head of the proximal phalanx and in the middle phalanx corresponding with the progressive radiographic signs of bony demineralization along the medial margin of the head of the proximal phalanx, and scalloping and progressive volume loss in the medial proximal aspect of the middle phalanx. This enhancement is well shown for example on images 5-6 of series 10, with accentuated T2 signal in the involved bones shown on images 22 through 24 of series 8. The imaging appearance is suspicious for septic proximal interphalangeal joint with involvement of the head of the proximal phalanx and of the middle phalanx, with gout or other erosive arthropathy considered less likely in this clinical circumstance. Prior amputation of the great toe at the MTP joint. Ligaments The large field of view precludes detailed ligamentous assessment. The Lisfranc ligament appears grossly intact. Muscles and Tendons Low-level edema tracking within along plantar musculature and the interosseous muscles, probably neurogenic rather than from infectious myositis. Thickened medial band of the plantar fascia proximally with adjacent plantar calcaneal spur, suspicious for plantar fasciitis. Distal tibialis posterior tendinopathy. Soft tissues Subcutaneous edema mild enhancement in the dorsal subcutaneous tissues compatible with edema and potentially mild cellulitis. Subcutaneous edema and enhancement particularly in the fourth toe. Low-level infiltrative edema along the ball of the foot. No drainable abscess. IMPRESSION: 1. Abnormal edema and enhancement in the distal phalanx fourth toe with associated subcutaneous edema and enhancement, compatible with osteomyelitis of the distal phalanx. The  proximal and middle phalanges have normal signal. 2. Progressive demineralization in the head of  the proximal phalanx and adjacent middle phalanx of the third toe with local enhancement and edema signal suspicious for septic proximal interphalangeal joint with osteomyelitis involving the middle phalanx in the head of the proximal phalanx. 3. 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 4. 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 5. Small osteochondral lesion along the plantar margin of the head of the third metatarsal, this is nonspecific and could possibly be degenerative. 6. Plantar fasciitis involving the proximal medial band of the plantar fascia. 7. Distal tibialis posterior tendinopathy. 8. Cellulitis in the subcutaneous tissues of the fourth toe. Nonspecific low-level infiltrative edema in the dorsum of the foot and along the ball of the foot. No drainable soft tissue abscess identified. These results were called by telephone at the time of interpretation on 01/20/2022 at 8:15 am to provider Dr. Kathaleen Bury, who verbally acknowledged these results. Electronically Signed   By: Van Clines M.D.   On: 01/20/2022 08:17   US ARTERIAL ABI (SCREENING LOWER EXTREMITY)  Result Date: 01/19/2022 CLINICAL DATA:  Neuropathy, limited feelings in legs EXAM: NONINVASIVE PHYSIOLOGIC VASCULAR STUDY OF BILATERAL LOWER EXTREMITIES TECHNIQUE: Evaluation of both lower extremities were performed at rest, including calculation of ankle-brachial indices with single level Doppler, pressure and pulse volume recording. COMPARISON:  None. FINDINGS: Right ABI:  1.5 Left ABI:  1.5 Right Lower Extremity: Normal arterial waveforms at the ankle. The anterior and posterior tibial arteries are patent. Left Lower Extremity: Normal arterial waveforms at the ankle. The anterior and posterior tibial arteries are patent. > 1.4  Non diagnostic secondary to incompressible vessel calcifications (medial arterial sclerosis of [place]) IMPRESSION: Bilateral ABIs (>1.4) are nondiagnostic secondary to incompressible vessels calcifications. The bilateral anterior and posterior tibial arteries are patent with normal waveforms. Electronically Signed   By: Albin Felling M.D.   On: 01/19/2022 09:40   DG Foot Complete Left  Result Date: 01/18/2022 CLINICAL DATA:  Pain.  Wound on third toe. EXAM: LEFT FOOT - COMPLETE 3+ VIEW COMPARISON:  Left foot radiographs 08/25/2021 FINDINGS: Large calcaneal heel spur. Mild chronic spurring at the Achilles insertion on the calcaneus, unchanged. Appearance severe navicular-lateral cuneiform > navicular-cuboid and intermediate cuneiform joint space narrowing with unchanged dorsal bone hypertrophy at the level of the navicular on lateral view, likely degenerative. Postsurgical changes are again seen of amputation of the great toe phalanges. Reportedly there is a left third toe wound. There is mild erosion of the distal medial aspect of the proximal phalanx of the third toe with possible punctate erosion of the adjacent medial base of the middle phalanx. There is ill-defined mineralization that appears mildly separated from the distal medial aspect of the proximal phalanx. Findings are suspicious for osteomyelitis. Previously there was mild curvilinear mineralization medial to the distal aspect of the proximal phalanx without definite bone erosion seen. 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 IMPRESSION:: IMPRESSION: 1. Cortical erosion suspicious for osteomyelitis of the distal medial aspect of the proximal phalanx and possibly minimally within the adjacent middle phalanx of the third toe. 2. Additional chronic findings as above. Electronically Signed   By: Yvonne Kendall M.D.   On: 01/18/2022 14:26    Assessment/Plan:  46yo M with  T2DM c/b peripheral neuropathy, with early osteo to left foot.   - continue on vancomycin while hospitalized but will plan to do bactrim DS plus amox/clav (if he can tolerate,  see below)  With the plans to get dalbavancin Q 2 wk x 3 doses ( instead of bactrim DS) as an outpatient  Hx of childhood penicillin allergy =the majority of people outgrow their childhood allergy. We will do a 2 dose challenge of amoxicillin to ensure he tolerates. Giving 1st dose at noon to ensure no signs of anaphylaxis. If he tolerates, he will likely be able to take augmentin for 6 wk.  Plan for him to follow up in the ID clinic in 4-6 wk. Also have him see dr duda as outpatient.  T2DM = appears not at goal. Recommend to continue BS checks and better dietary intake.  Elzie [facility] [phone]

## 2022-01-20 NOTE — Progress Notes (Signed)
Patient discharged to home with instructions and verbalized understanding. 

## 2022-01-20 NOTE — Progress Notes (Addendum)
Subjective:  Patient reports no pain. He underwent MRI overnight. He remains stable on RNF. He has been seen by ID who recommend discharge today with outpatient antibiotic course.  Objective:   VITALS:  Temp:  [98.3 F (36.8 C)-98.6 F (37 C)] 98.6 F (37 C) (01/27 1646) Pulse Rate:  [77-90] 90 (01/27 1646) Resp:  [18-19] 19 (01/27 1646) BP: (136-141)/(70-73) 141/70 (01/27 1646) SpO2:  [98 %] 98 % (01/27 1646)  Gen: AAOx3, NAD   Left lower extremity: Well healed scar at prior 1st MTP disarticulation stump site Mild erythema of 4th lesser toe with superficial ulceration present at distal tip - no drainage and does not probe deep on exam He has pinpoint superficial ulceration over 3rd webspace and anterior ankle without any drainage or surrounding erythema (baseline per patient for years) Mild TTP over 4th toe Wiggles toes APF, ADF 5/5 Diffuse numbness over toes and plantar (baseline per patient for years) DP and PT 2+ CR<2s   LABS Recent Labs    01/18/22 1423 01/19/22 0357 01/20/22 0207  HGB 16.0 15.0 14.5  WBC 9.5 8.7 8.3  PLT 226 208 207   Recent Labs    01/19/22 0357 01/20/22 0207  NA 131* 136  K 3.5 3.8  CL 101 104  CO2 24 24  BUN 15 10  CREATININE 0.82 0.79  GLUCOSE 140* 174*   No results for input(s): LABPT, INR in the last 72 hours.   Assessment/Plan: 46 year male with poorly controlled T2DM (A1c 8.9, not on insulin), dense peripheral neuropathy. He is currently admitted for left 4th toe cellulitis x 5 days.   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 Wounds do not probe to bone. If antibiotic  course were to fail, we can consider amputation likely in the form of left foot 4th toe disarticulation at the MTP joint vs resection through the proximal  phalanx of this digit -pt agreeable with above plan as he does not wish for further amputation at this time unless he fails antibiotic trial -he remains currently completely stable on the floor and blood cultures are in process -recommend patient optimize glycemic control for long term control of his diabetes, potentially with initiation of insulin therapy if indicated -follow up as outpatient with Dr. Sharol Given or myself for continued monitoring of his left foot and possible surgical intervention as indicated  Armond Hang 01/20/2022, 9:42 PM

## 2022-01-20 NOTE — Discharge Summary (Signed)
Physician Discharge Summary  John Church [id number] DOB: 01-Feb-1976 DOA: 01/18/2022  PCP: Lavella Lemons, PA  Admit date: 01/18/2022 Discharge date: 01/20/2022  Admitted From: home Discharge disposition: home   Recommendations for Outpatient Follow-Up:   plan to do bactrim DS plus amox/clav (for 6 weeks); With the plans to get dalbavancin Q 2 wk x 3 doses ( instead of bactrim DS) as an outpatient   Discharge Diagnosis:   Principal Problem:   Toe osteomyelitis, left (HCC) Active Problems:   DM (diabetes mellitus) ([place])   Amputated toe of left foot ([place])    Discharge Condition: Improved.  Diet recommendation:  Carbohydrate-modified..  Wound care: None.  Code status: Full.   History of Present Illness:   John Church is a 46 y.o. male with medical history significant for diabetes mellitus, osteomyelitis and amputation of left foot great toe. Patient presented to the ED with complaints of discoloration to his left third toe noticed 3 days ago.  Initially reddish then turned black.  He has peripheral neuropathy, so he has not had any pain to his foot except when he puts pressure on his toe. Patient had amputation to left foot big toe 04/2021, done by Dr. Sharol Given.  Family history of toe amputations, his brother also diabetic, has had similar amputations also in the past year.   He otherwise denies any other complaints.  Reports compliance with his medications for his diabetes.   Hospital Course by Problem:      early osteo to left foot.   - bactrim DS plus amox/clav- he passed the challenge in the hospital -plans to get dalbavancin Q 2 wk x 3 doses ( instead of bactrim DS) as an outpatient -take augmentin for 6 wk. - follow up in the ID clinic in 4-6 wk.  - see dr duda as outpatient. -Bilateral ABIs done   T2DM  - appears not at goal: HgbA1c: 8.2 -encouraged strict diet and close follow up with PCP to ensure healing  Obesity Estimated body mass index  is 32.1 kg/m as calculated from the following:   Height as of this encounter: 6\' 2"  (1.88 m).   Weight as of this encounter: 113.4 kg.    Medical Consultants:   ID ortho   Discharge Exam:   Vitals:   01/20/22 0824 01/20/22 1646  BP: 136/73 (!) 141/70  Pulse: 77 90  Resp: 18 19  Temp: 98.3 F (36.8 C) 98.6 F (37 C)  SpO2: 98% 98%   Vitals:   01/19/22 1656 01/19/22 2055 01/20/22 0824 01/20/22 1646  BP: (!) 163/94 (!) 141/93 136/73 (!) 141/70  Pulse: 91 85 77 90  Resp: 19 18 18 19   Temp: 98.5 F (36.9 C) 98.3 F (36.8 C) 98.3 F (36.8 C) 98.6 F (37 C)  TempSrc: Oral Oral Oral   SpO2: 98% 97% 98% 98%  Weight:      Height:        General exam: Appears calm and comfortable.   The results of significant diagnostics from this hospitalization (including imaging, microbiology, ancillary and laboratory) are listed below for reference.     Procedures and Diagnostic Studies:   MR FOOT LEFT W WO CONTRAST  Result Date: 01/20/2022 CLINICAL DATA:  Osteomyelitis of the left foot. Prior great toe amputation 04/29/2021. Diabetes. EXAM: MRI OF THE LEFT FOREFOOT WITHOUT AND WITH CONTRAST TECHNIQUE: Multiplanar, multisequence MR imaging of the left foot was performed both before and after administration of intravenous contrast. Osteomyelitis  protocol MRI of the foot was obtained, to include the entire foot and ankle. This protocol uses a large field of view to cover the entire foot and ankle, and is suitable for assessing bony structures for osteomyelitis. Due to the large field of view and imaging plane choice, this protocol is less sensitive for assessing small structures such as ligamentous structures of the foot and ankle, compared to a dedicated forefoot or dedicated hindfoot exam. CONTRAST:  45mPrivate Diagnostic ClinicVa Medical Center - BathVidant Dupl[facility] H74[place]3-4567[place][16[facility](40[place]556m5[facility](574)CapitalPioneer Memorial HospitalArtel LLC D[facility] Out[facility] Surgical22[place]8-0388m6Wayne Unc HealtKootenai Medical CenterSelect Specialty Hospital - 50[place]6-5333m6Pl[facility]iss772[facility] HealHutchinson Clinic Pa Inc Dba Hutch[facility] Endoscopy CenterBrookside Surgery(757[place]) 0168m6[facility] SBaptist Medical Center - PrincetonSampson [facility] Medical(24[place]8)0323m6[facility]442SoutFroedtert Surgery Center LLCUnc [facility] H(41[place]7)5536[facility] SDesert Valley Hospitalurg47mNortheast Rehab HospitalRegional [facility] H41[place]2-39100m6American FoMidmichigan Medical Center-GladwinChristus Dubuis [facility](850[place]) 8057m6[facility]509BakersfOrthocolorado Hospital [facility] Anthony Med CampusMead[facility]33[place]7-4843m6[facility](269)BaylorBaptist [facility] BurenSan [facility]oscopy43[place]5-8336m6Va [facility] [place] HeaWestchester Medical CenterLake Wales Medical(58[place]6)2328m6Freedom BehaHhc Southington Surgery Center LLCLive Oak Endoscopy CenKentucky405583m6[facility]j662MeSaint Clares Hospital - Boonton Township CampusMontpelier Surgery36[place]4-1817m6MortonSouth Shore HospitalBigfork Valley H(33[place]0)[phone](718)[facility]903 JackqulynArMarland KitchennHilNew Jersey1lEstell [166 /25/2023 and 04/25/2021 FINDINGS: Bones/Joint/Cartilage Abnormal scalloped anterior margin of  the lateral portion of the navicular subcortical low T2 signal with serpentine marginal T1 signal, appearance favors avascular necrosis and could have a subacute component. Similarly, there is abnormal marrow edema signal and enhancement in the head of the second metatarsal with subcortical hypoenhancement along the distal articular subcortical surface any manner suspicious for avascular necrosis, image 9 series 10. Small focus of edema signal and enhancement along the plantar margin of the third metatarsal head for example on image 20 series 8 and image 20 series 11, possibly degenerative but technically nonspecific. Abnormal hand enhancement in this vicinity measures about 0.6 by 0.3 cm and is notably less striking than in the second metatarsal head. There is abnormal edema and enhancement in the distal phalanx of the fourth toe favoring osteomyelitis, with surrounding subcutaneous edema. In the third toe, which by report is relatively benign clinically, there is abnormal edema and enhancement in the distal head of the proximal phalanx and in the middle phalanx corresponding with the progressive radiographic signs of bony demineralization along the medial margin of the head of the proximal phalanx, and scalloping and progressive volume loss in the medial proximal aspect of the middle phalanx. This enhancement is well shown for example on images 5-6 of series 10, with accentuated T2 signal in the involved bones shown on images 22 through 24 of series 8. The imaging appearance is suspicious for septic proximal interphalangeal joint with involvement of the head of the proximal phalanx and of the middle phalanx, with gout or other erosive arthropathy considered less likely in this clinical circumstance. Prior amputation of the great toe at the MTP joint. Ligaments The large field of view precludes detailed ligamentous assessment. The Lisfranc ligament appears grossly intact. Muscles and Tendons Low-level edema  tracking within along plantar musculature and the interosseous muscles, probably neurogenic rather than from infectious myositis. Thickened medial band of the plantar fascia proximally with adjacent plantar calcaneal spur, suspicious for plantar fasciitis. Distal tibialis posterior tendinopathy. Soft tissues Subcutaneous edema mild enhancement in the dorsal subcutaneous tissues compatible with edema and potentially mild cellulitis. Subcutaneous edema and enhancement particularly in the fourth toe. Low-level infiltrative edema along the ball of the foot. No drainable abscess. IMPRESSION: 1. Abnormal edema and enhancement in the distal phalanx fourth toe with associated subcutaneous edema and enhancement, compatible with osteomyelitis of the distal phalanx. The proximal and middle phalanges have normal signal. 2. Progressive demineralization in the head of the proximal phalanx and adjacent middle phalanx of the third toe with local enhancement and edema signal suspicious for septic proximal interphalangeal joint with osteomyelitis involving the middle phalanx in the head of the proximal phalanx. 3. 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 4. 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 5. Small osteochondral lesion along the plantar margin of the head of the third metatarsal,  this is nonspecific and could possibly be degenerative. 6. Plantar fasciitis involving the proximal medial band of the plantar fascia. 7. Distal tibialis posterior tendinopathy. 8. Cellulitis in the subcutaneous tissues of the fourth toe. Nonspecific low-level infiltrative edema in the dorsum of the foot and along the ball of the foot. No drainable soft tissue abscess identified. These results were called by telephone at the time of interpretation on 01/20/2022 at 8:15 am to provider Dr. Kathaleen Bury, who verbally acknowledged these  results. Electronically Signed   By: Van Clines M.D.   On: 01/20/2022 08:17   US ARTERIAL ABI (SCREENING LOWER EXTREMITY)  Result Date: 01/19/2022 CLINICAL DATA:  Neuropathy, limited feelings in legs EXAM: NONINVASIVE PHYSIOLOGIC VASCULAR STUDY OF BILATERAL LOWER EXTREMITIES TECHNIQUE: Evaluation of both lower extremities were performed at rest, including calculation of ankle-brachial indices with single level Doppler, pressure and pulse volume recording. COMPARISON:  None. FINDINGS: Right ABI:  1.5 Left ABI:  1.5 Right Lower Extremity: Normal arterial waveforms at the ankle. The anterior and posterior tibial arteries are patent. Left Lower Extremity: Normal arterial waveforms at the ankle. The anterior and posterior tibial arteries are patent. > 1.4 Non diagnostic secondary to incompressible vessel calcifications (medial arterial sclerosis of [place]ckeberg) IMPRESSION: Bilateral ABIs (>1.4) are nondiagnostic secondary to incompressible vessels calcifications. The bilateral anterior and posterior tibial arteries are patent with normal waveforms. Electronically Signed   By: Albin Felling M.D.   On: 01/19/2022 09:40   DG Foot Complete Left  Result Date: 01/18/2022 CLINICAL DATA:  Pain.  Wound on third toe. EXAM: LEFT FOOT - COMPLETE 3+ VIEW COMPARISON:  Left foot radiographs 08/25/2021 FINDINGS: Large calcaneal heel spur. Mild chronic spurring at the Achilles insertion on the calcaneus, unchanged. Appearance severe navicular-lateral cuneiform > navicular-cuboid and intermediate cuneiform joint space narrowing with unchanged dorsal bone hypertrophy at the level of the navicular on lateral view, likely degenerative. Postsurgical changes are again seen of amputation of the great toe phalanges. Reportedly there is a left third toe wound. There is mild erosion of the distal medial aspect of the proximal phalanx of the third toe with possible punctate erosion of the adjacent medial base of the middle phalanx.  There is ill-defined mineralization that appears mildly separated from the distal medial aspect of the proximal phalanx. Findings are suspicious for osteomyelitis. Previously there was mild curvilinear mineralization medial to the distal aspect of the proximal phalanx without definite bone erosion seen. 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 IMPRESSION:: IMPRESSION: 1. Cortical erosion suspicious for osteomyelitis of the distal medial aspect of the proximal phalanx and possibly minimally within the adjacent middle phalanx of the third toe. 2. Additional chronic findings as above. Electronically Signed   By: Yvonne Kendall M.D.   On: 01/18/2022 14:26     Labs:   Basic Metabolic Panel: Recent Labs  Lab 01/18/22 1423 01/19/22 0357 01/20/22 0207  NA 136 131* 136  K 4.1 3.5 3.8  CL 101 101 104  CO2 26 24 24   GLUCOSE 156* 140* 174*  BUN 13 15 10   CREATININE 0.82 0.82 0.79  CALCIUM 8.8* 8.1* 8.5*   GFR Estimated Creatinine Clearance: 156.2 mL/min (by C-G formula based on SCr of 0.79 mg/dL). Liver Function Tests: No results for input(s): AST, ALT, ALKPHOS, BILITOT, PROT, ALBUMIN in the last 168 hours. No results for input(s): LIPASE, AMYLASE in the last 168 hours. No results for input(s): AMMONIA in the last 168 hours. Coagulation profile No results  for input(s): INR, PROTIME in the last 168 hours.  CBC: Recent Labs  Lab 01/18/22 1423 01/19/22 0357 01/20/22 0207  WBC 9.5 8.7 8.3  NEUTROABS 6.3  --   --   HGB 16.0 15.0 14.5  HCT 46.1 44.3 41.8  MCV 86.7 87.5 85.1  PLT 226 208 207   Cardiac Enzymes: No results for input(s): CKTOTAL, CKMB, CKMBINDEX, TROPONINI in the last 168 hours. BNP: Invalid input(s): POCBNP CBG: Recent Labs  Lab 01/19/22 1240 01/19/22 1802 01/19/22 2346 01/20/22 0602 01/20/22 1127  GLUCAP 142* 191* 164* 170* 222*   D-Dimer No results for input(s): DDIMER in the last 72  hours. Hgb A1c Recent Labs    01/18/22 1757  HGBA1C 8.2*   Lipid Profile No results for input(s): CHOL, HDL, LDLCALC, TRIG, CHOLHDL, LDLDIRECT in the last 72 hours. Thyroid function studies No results for input(s): TSH, T4TOTAL, T3FREE, THYROIDAB in the last 72 hours.  Invalid input(s): FREET3 Anemia work up No results for input(s): VITAMINB12, FOLATE, FERRITIN, TIBC, IRON, RETICCTPCT in the last 72 hours. Microbiology Recent Results (from the past 240 hour(s))  Resp Panel by RT-PCR (Flu A&B, Covid) Nasopharyngeal Swab     Status: None   Collection Time: 01/18/22  3:25 PM   Specimen: Nasopharyngeal Swab; Nasopharyngeal(NP) swabs in vial transport medium  Result Value Ref Range Status   SARS Coronavirus 2 by RT PCR NEGATIVE NEGATIVE Final    Comment: (NOTE) SARS-CoV-2 target nucleic acids are NOT DETECTED.  The SARS-CoV-2 RNA is generally detectable in upper respiratory specimens during the acute phase of infection. The lowest concentration of SARS-CoV-2 viral copies this assay can detect is 138 copies/mL. A negative result does not preclude SARS-Cov-2 infection and should not be used as the sole basis for treatment or other patient management decisions. A negative result may occur with  improper specimen collection/handling, submission of specimen other than nasopharyngeal swab, presence of viral mutation(s) within the areas targeted by this assay, and inadequate number of viral copies(<138 copies/mL). A negative result must be combined with clinical observations, patient history, and epidemiological information. The expected result is Negative.  Fact Sheet for Patients:  EntrepreneurPulse.com.au  Fact Sheet for Healthcare Providers:  IncredibleEmployment.be  This test is no t yet approved or cleared by the [place] FDA and  has been authorized for detection and/or diagnosis of SARS-CoV-2 by FDA under an Emergency Use Authorization  (EUA). This EUA will remain  in effect (meaning this test can be used) for the duration of the COVID-19 declaration under Section 564(b)(1) of the Act, 21 U.S.C.section 360bbb-3(b)(1), unless the authorization is terminated  or revoked sooner.       Influenza A by PCR NEGATIVE NEGATIVE Final   Influenza B by PCR NEGATIVE NEGATIVE Final    Comment: (NOTE) The Xpert Xpress SARS-CoV-2/FLU/RSV plus assay is intended as an aid in the diagnosis of influenza from Nasopharyngeal swab specimens and should not be used as a sole basis for treatment. Nasal washings and aspirates are unacceptable for Xpert Xpress SARS-CoV-2/FLU/RSV testing.  Fact Sheet for Patients: EntrepreneurPulse.com.au  Fact Sheet for Healthcare Providers: IncredibleEmployment.be  This test is not yet approved or cleared by the [place] FDA and has been authorized for detection and/or diagnosis of SARS-CoV-2 by FDA under an Emergency Use Authorization (EUA). This EUA will remain in effect (meaning this test can be used) for the duration of the COVID-19 declaration under Section 564(b)(1) of the Act, 21 U.S.C. section 360bbb-3(b)(1), unless the authorization is terminated or revoked.  Performed at [facility], [address] [place], [place] [address]   Culture, blood (routine x 2)     Status: None (Preliminary result)   Collection Time: 01/18/22  5:57 PM   Specimen: BLOOD RIGHT ARM  Result Value Ref Range Status   Specimen Description BLOOD RIGHT ARM  Final   Special Requests   Final    BOTTLES DRAWN AEROBIC AND ANAEROBIC Blood Culture adequate volume   Culture   Final    NO GROWTH 1 DAY Performed at [facility], [address] [place], [place] [address]    Report Status PENDING  Incomplete  Culture, blood (routine x 2)     Status: None (Preliminary result)   Collection Time: 01/18/22  5:57 PM   Specimen: BLOOD LEFT ARM  Result Value Ref Range Status   Specimen  Description BLOOD LEFT ARM  Final   Special Requests   Final    Blood Culture adequate volume BOTTLES DRAWN AEROBIC AND ANAEROBIC   Culture   Final    NO GROWTH 1 DAY Performed at [facility], [address], [place], [place] [address]    Report Status PENDING  Incomplete     Discharge Instructions:   Discharge Instructions     Diet - low sodium heart healthy   Complete by: As directed    Diet Carb Modified   Complete by: As directed    Discharge instructions   Complete by: As directed    plan to do bactrim DS plus amox/clav : augmentin for 6 wk. With the plans to get dalbavancin Q 2 wk x 3 doses ( instead of bactrim DS) as an outpatient   follow up in the ID clinic in 4-6 wk. Also have him see dr duda as outpatient   Increase activity slowly   Complete by: As directed    No wound care   Complete by: As directed       Allergies as of 01/20/2022       Reactions   Penicillins    Did it involve swelling of the face/tongue/throat, SOB, or low BP? Unknown Did it involve sudden or severe rash/hives, skin peeling, or any reaction on the inside of your mouth or nose? Unknown Did you need to seek medical attention at a hospital or doctor's office? Unknown When did it last happen? 46 year old       If all above answers are "NO", may proceed with cephalosporin use. CHILDHOOD ALLERGY        Medication List     STOP taking these medications    doxycycline 100 MG capsule Commonly known as: VIBRAMYCIN       TAKE these medications    Accu-Chek Guide test strip Generic drug: glucose blood Use as instructed to monitor glucose 4 times daily   amoxicillin-clavulanate 500-125 MG tablet Commonly known as: AUGMENTIN Take 1 tablet (500 mg total) by mouth 3 (three) times daily.   calcium carbonate 500 MG chewable tablet Commonly known as: TUMS - dosed in mg elemental calcium Chew 1 tablet by mouth daily.   glipiZIDE 5 MG 24 hr tablet Commonly known as: GLUCOTROL XL Take 1  tablet (5 mg total) by mouth daily with breakfast.   metFORMIN 500 MG tablet Commonly known as: GLUCOPHAGE Take 2 tablets by mouth twice daily   Onglyza 5 MG Tabs tablet Generic drug: saxagliptin HCl Take 1 tablet (5 mg total) by mouth every evening.   rosuvastatin 5 MG tablet Commonly known as: CRESTOR  Take 5 mg by mouth daily.   sulfamethoxazole-trimethoprim 800-160 MG tablet Commonly known as: BACTRIM DS Take 1 tablet by mouth every 12 (twelve) hours. What changed: when to take this        Follow-up Information     Lavella Lemons, PA Follow up.   Specialty: Physician Assistant Contact information: [place] [address] [phone]         Carlyle Basques, MD Follow up.   Specialty: Infectious Diseases Contact information: [place] [address] [phone]         Newt Minion, MD Follow up.   Specialty: Orthopedic Surgery Contact information: [address] [place] [address] [phone]                  Time coordinating discharge: 35 min  Signed:  Geradine Girt DO  [facility] 01/20/2022, 4:48 PM

## 2022-01-23 LAB — CULTURE, BLOOD (ROUTINE X 2)
Culture: NO GROWTH
Culture: NO GROWTH
Special Requests: ADEQUATE
Special Requests: ADEQUATE

## 2022-02-01 ENCOUNTER — Other Ambulatory Visit: Payer: Self-pay

## 2022-02-01 ENCOUNTER — Ambulatory Visit (HOSPITAL_COMMUNITY)
Admission: RE | Admit: 2022-02-01 | Discharge: 2022-02-01 | Disposition: A | Discharge status: Home / Self Care | Payer: 59 | Source: Ambulatory Visit | Attending: Internal Medicine | Admitting: Internal Medicine

## 2022-02-01 VITALS — BP 142/92 | HR 81 | Temp 97.6°F | Resp 20 | Wt 250.0 lb

## 2022-02-01 DIAGNOSIS — M869 Osteomyelitis, unspecified: Secondary | ICD-10-CM | POA: Diagnosis not present

## 2022-02-01 MED ORDER — DEXTROSE 5 % IV SOLN
1500.0000 mg | Freq: Once | INTRAVENOUS | Status: AC
  Administered 2022-02-01: 1500 mg via INTRAVENOUS
  Filled 2022-02-01: qty 75

## 2022-02-01 MED ORDER — SODIUM CHLORIDE 0.9% FLUSH: 10.0000 mL | INTRAVENOUS | Status: DC | PRN

## 2022-02-02 ENCOUNTER — Ambulatory Visit (INDEPENDENT_AMBULATORY_CARE_PROVIDER_SITE_OTHER): Payer: 59 | Admitting: Orthopedic Surgery

## 2022-02-02 ENCOUNTER — Other Ambulatory Visit: Payer: Self-pay

## 2022-02-02 DIAGNOSIS — M869 Osteomyelitis, unspecified: Secondary | ICD-10-CM | POA: Diagnosis not present

## 2022-02-04 ENCOUNTER — Encounter: Payer: Self-pay | Admitting: Orthopedic Surgery

## 2022-02-04 NOTE — Progress Notes (Signed)
Office Visit Note   Patient: John Church           Date of Birth: 1976/04/10           MRN: [id number] Visit Date: 02/02/2022              Requested by: Lavella Lemons, PA [place],  [place] [address] PCP: Lavella Lemons, [place]  Chief Complaint  Patient presents with   Left Foot - Follow-up    Left 3rd toe osteomyelitis ED 01/18/22      HPI: Patient is a 46 year old gentleman who is seen for evaluation for left foot third toe osteomyelitis.  Patient is diabetic he has had an MRI scan as well as ankle-brachial indices.  He is status post a left foot great toe amputation in 2022.  He is currently on oral antibiotics.  Patient states that he received IV antibiotic at a infusion center.  Assessment & Plan: Visit Diagnoses:  1. Osteomyelitis of third toe of left foot ([place])    Plan: Patient does have redness and swelling of the fourth toe.  There is no evidence of abscess.  Would continue the antibiotics and see if this will resolve without surgical intervention.  Follow-Up Instructions: Return in about 4 weeks (around 03/02/2022).   Ortho Exam  Patient is alert, oriented, no adenopathy, well-dressed, normal affect, normal respiratory effort. Examination patient has a palpable pulse he has redness and swelling of the fourth toe.  After informed consent a 10 blade knife was used to pare the callus there was no signs of abscess no purulent drainage no exposed bone.  Review of the MRI scan shows increased edema in the distal phalanx of the fourth toe consistent with osteomyelitis.  Also increased edema in the third toe with possible osteomyelitis.  There is no identifiable abscess.  Review of the ankle-brachial indices shows a value of 1.5 consistent with calcified arteries not diagnostic for decreased circulation  Imaging: No results found. No images are attached to the encounter.  Labs: Lab Results  Component Value Date   HGBA1C 8.2 (H) 01/18/2022   HGBA1C 6.0 (A)  09/01/2021   ESRSEDRATE 22 (H) 01/18/2022   CRP 1.0 (H) 01/18/2022   REPTSTATUS 01/23/2022 FINAL 01/18/2022   REPTSTATUS 01/23/2022 FINAL 01/18/2022   GRAMSTAIN  02/17/2012    FEW WBC PRESENT, PREDOMINANTLY PMN NO SQUAMOUS EPITHELIAL CELLS SEEN MODERATE GRAM POSITIVE COCCI IN PAIRS FEW GRAM NEGATIVE RODS   CULT  01/18/2022    NO GROWTH 5 DAYS Performed at [facility], [address] [place], [place] [address]    CULT  01/18/2022    NO GROWTH 5 DAYS Performed at [facility], [address], [place], [place] [address]      Lab Results  Component Value Date   ALBUMIN 3.8 12/01/2019   ALBUMIN 3.7 06/23/2009    No results found for: MG No results found for: VD25OH  No results found for: PREALBUMIN CBC EXTENDED Latest Ref Rng & Units 01/20/2022 01/19/2022 01/18/2022  WBC 4.0 - 10.5 K/uL 8.3 8.7 9.5  RBC 4.22 - 5.81 MIL/uL 4.91 5.06 5.32  HGB 13.0 - 17.0 g/dL 14.5 15.0 16.0  HCT 39.0 - 52.0 % 41.8 44.3 46.1  PLT 150 - 400 K/uL 207 208 226  NEUTROABS 1.7 - 7.7 K/uL - - 6.3  LYMPHSABS 0.7 - 4.0 K/uL - - 2.2     There is no height or weight on file to calculate BMI.  Orders:  No orders of the defined types were placed in this encounter.  No orders of the defined types were placed in this encounter.    Procedures: No procedures performed  Clinical Data: No additional findings.  ROS:  All other systems negative, except as noted in the HPI. Review of Systems  Objective: Vital Signs: There were no vitals taken for this visit.  Specialty Comments:  No specialty comments available.  PMFS History: Patient Active Problem List   Diagnosis Date Noted   Toe osteomyelitis, left ([place]) 01/18/2022   DM (diabetes mellitus) ([place]) 01/18/2022   Amputated toe of left foot ([place]) 01/18/2022   Osteomyelitis of great toe of left foot ([facility])    Past Medical History:  Diagnosis Date   COVID-19 2020   Diabetes mellitus without complication ([place])    type 2   Dyspnea    wtih  exertion   History of kidney stones    Peripheral neuropathy     Family History  Problem Relation Age of Onset   Diabetes Mother    Diabetes Father    Diabetes Brother     Past Surgical History:  Procedure Laterality Date   AMPUTATION Left 04/29/2021   Procedure: LEFT GREAT TOE AMPUTATION;  Surgeon: Newt Minion, MD;  Location: [facility];  Service: Orthopedics;  Laterality: Left;   MULTIPLE TOOTH EXTRACTIONS     Social History   Occupational History   Not on file  Tobacco Use   Smoking status: Never   Smokeless tobacco: Never  Vaping Use   Vaping Use: Never used  Substance and Sexual Activity   Alcohol use: Not Currently   Drug use: Never   Sexual activity: Not on file

## 2022-02-14 ENCOUNTER — Other Ambulatory Visit: Payer: Self-pay

## 2022-02-14 ENCOUNTER — Ambulatory Visit (INDEPENDENT_AMBULATORY_CARE_PROVIDER_SITE_OTHER): Payer: 59 | Admitting: Internal Medicine

## 2022-02-14 ENCOUNTER — Encounter: Payer: Self-pay | Admitting: Internal Medicine

## 2022-02-14 VITALS — BP 142/91 | HR 80 | Temp 98.6°F | Ht 74.0 in | Wt 257.0 lb

## 2022-02-14 DIAGNOSIS — M869 Osteomyelitis, unspecified: Secondary | ICD-10-CM | POA: Diagnosis not present

## 2022-02-14 NOTE — Progress Notes (Signed)
RFV: hospital follow up for dfu/osteomyelitis of 3rd-4th toe  Patient ID: John Church, male   DOB: October 27, 1976, 46 y.o.   MRN: [id number]  HPI  John Church is a 46 y.o. male with history of T2DM c/b peripheral neuropathy who has hx of left great toe amputation in 2022 who has been wearing compression stocking to left leg for swelling however he started to notice pain and worsening discoloration to 4th toe of left foot. He also has noticed blood sugars being labile which he attributes to holidays. Due to worsening discoloration, cellulitis, he went to [facility] ED for evaluation. He was started on vancomycin for cellulitis and transferred to [facility] for evaluation by orthopedics. His WBC 8.7, sed rate of 22. MRI did show distal phalynx of 4th digit having signs of osteo but also 3rd digit PIP septic arthritis and cellulitis changes. He states that the erythema of his left foot has improved. He was seen by surgery who feel that he could do medical management he has been on amox/clav since discharge at end of January. He has seen dr duda on feb 9th and felt that he was healing with plans to see back on march 9th. Having about 3-4 mushy stools per day while on abtx.   Has not had further injury to left foot  Outpatient Encounter Medications as of 02/14/2022  Medication Sig   amoxicillin-clavulanate (AUGMENTIN) 500-125 MG tablet Take 1 tablet (500 mg total) by mouth 3 (three) times daily.   glipiZIDE (GLUCOTROL XL) 5 MG 24 hr tablet Take 1 tablet (5 mg total) by mouth daily with breakfast.   glucose blood (ACCU-CHEK GUIDE) test strip Use as instructed to monitor glucose 4 times daily   metFORMIN (GLUCOPHAGE) 500 MG tablet Take 2 tablets by mouth twice daily   ONGLYZA 5 MG TABS tablet Take 1 tablet (5 mg total) by mouth every evening.   rosuvastatin (CRESTOR) 5 MG tablet Take 5 mg by mouth daily.   calcium carbonate (TUMS - DOSED IN MG ELEMENTAL CALCIUM) 500 MG chewable tablet Chew 1 tablet by mouth daily.  (Patient not taking: Reported on 02/14/2022)   sulfamethoxazole-trimethoprim (BACTRIM DS) 800-160 MG tablet Take 1 tablet by mouth every 12 (twelve) hours. (Patient not taking: Reported on 02/14/2022)   No facility-administered encounter medications on file as of 02/14/2022.     Patient Active Problem List   Diagnosis Date Noted   Toe osteomyelitis, left ([place]) 01/18/2022   DM (diabetes mellitus) ([place]) 01/18/2022   Amputated toe of left foot ([place]) 01/18/2022   Osteomyelitis of great toe of left foot ([place])      Health Maintenance Due  Topic Date Due   COVID-19 Vaccine (1) Never done   FOOT EXAM  Never done   OPHTHALMOLOGY EXAM  Never done   URINE MICROALBUMIN  Never done   Hepatitis C Screening  Never done   TETANUS/TDAP  Never done   COLONOSCOPY (Pts 45-68yrs Insurance coverage will need to be confirmed)  Never done   INFLUENZA VACCINE  Never done     Review of Systems Review of Systems  Constitutional: Negative for fever, chills, diaphoresis, activity change, appetite change, fatigue and unexpected weight change.  HENT: Negative for congestion, sore throat, rhinorrhea, sneezing, trouble swallowing and sinus pressure.  Eyes: Negative for photophobia and visual disturbance.  Respiratory: Negative for cough, chest tightness, shortness of breath, wheezing and stridor.  Cardiovascular: Negative for chest pain, palpitations and leg swelling.  Gastrointestinal: + for nausea, negative vomiting, abdominal  pain, diarrhea, constipation, blood in stool, abdominal distention and anal bleeding.  Genitourinary: Negative for dysuria, hematuria, flank pain and difficulty urinating.  Musculoskeletal: Negative for myalgias, back pain, joint swelling, arthralgias and gait problem.  Skin: Negative for color change, pallor, rash and wound.  Neurological: Negative for dizziness, tremors, weakness and light-headedness.  Hematological: Negative for adenopathy. Does not bruise/bleed easily.   Psychiatric/Behavioral: Negative for behavioral problems, confusion, sleep disturbance, dysphoric mood, decreased concentration and agitation.   Physical Exam   BP (!) 142/91   Pulse 80   Temp 98.6 F (37 C) (Oral)   Ht 6\' 2"  (1.88 m)   Wt 257 lb (116.6 kg)   SpO2 100%   BMI 33.00 kg/m    Gen= a x o by 3 in nad HEENT = PERRLA, EOMI, no jaundice Ext = left foot 4th digit slightly redness. No tenderness. Tip of toe calloused CBC Lab Results  Component Value Date   WBC 8.3 01/20/2022   RBC 4.91 01/20/2022   HGB 14.5 01/20/2022   HCT 41.8 01/20/2022   PLT 207 01/20/2022   MCV 85.1 01/20/2022   MCH 29.5 01/20/2022   MCHC 34.7 01/20/2022   RDW 11.9 01/20/2022   LYMPHSABS 2.2 01/18/2022   MONOABS 0.6 01/18/2022   EOSABS 0.4 01/18/2022    BMET Lab Results  Component Value Date   NA 136 01/20/2022   K 3.8 01/20/2022   CL 104 01/20/2022   CO2 24 01/20/2022   GLUCOSE 174 (H) 01/20/2022   BUN 10 01/20/2022   CREATININE 0.79 01/20/2022   CALCIUM 8.5 (L) 01/20/2022   GFRNONAA >60 01/20/2022   GFRAA >60 12/01/2019   Lab Results  Component Value Date   ESRSEDRATE 22 (H) 01/18/2022   Lab Results  Component Value Date   CRP 1.0 (H) 01/18/2022      Assessment and Plan  IDDM c/b peripheral neuropathy = continue   DFU-osteo = Continue augmentin bid; will check sed rate and crp. Continue through march 9th Abtx diarrhea = try to do [place]

## 2022-02-15 LAB — C-REACTIVE PROTEIN: CRP: 14.5 mg/L — ABNORMAL HIGH (ref <8.0)

## 2022-02-15 LAB — SEDIMENTATION RATE: Sed Rate: 19 mm/h — ABNORMAL HIGH (ref 0–15)

## 2022-03-02 ENCOUNTER — Encounter: Payer: Self-pay | Admitting: Orthopedic Surgery

## 2022-03-02 ENCOUNTER — Ambulatory Visit (INDEPENDENT_AMBULATORY_CARE_PROVIDER_SITE_OTHER): Payer: 59 | Admitting: Orthopedic Surgery

## 2022-03-02 DIAGNOSIS — M869 Osteomyelitis, unspecified: Secondary | ICD-10-CM

## 2022-03-02 NOTE — Progress Notes (Signed)
? ?Office Visit Note ?  ?Patient: John Church           ?Date of Birth: [birth year]           ?MRN: [id number] ?Visit Date: 03/02/2022 ?             ?Requested by: Lovey Newcomer, PA ?[address] ?[place],  [place] [address] ?PCP: Lovey Newcomer, [place] ? ?Chief Complaint  ?Patient presents with  ? Left Foot - Follow-up  ?  Left 3rd toe osteomyelitis  ? ? ? ? ?HPI: ?Patient is a 46 year old gentleman who presents in follow-up for osteomyelitis left foot fourth toe he states the redness and swelling have resolved he is currently on Augmentin.  He recently saw infectious disease February 21. ? ?Assessment & Plan: ?Visit Diagnoses:  ?1. Osteomyelitis of fourth toe of left foot (HCC)   ? ? ?Plan: Patient will complete his course of antibiotics follow-up with infectious disease in 3 weeks and I will follow-up in 2 months. ? ?Follow-Up Instructions: Return in about 2 months (around 05/02/2022).  ? ?Ortho Exam ? ?Patient is alert, oriented, no adenopathy, well-dressed, normal affect, normal respiratory effort. ?Examination patient has a good dorsalis pedis pulse he does have venous swelling but there is no cellulitis no tenderness to palpation.  The swelling in the fourth toe is resolved the ulcer is closed there is no drainage no sausage digit swelling. ? ?Imaging: ?No results found. ?No images are attached to the encounter. ? ?Labs: ?Lab Results  ?Component Value Date  ? HGBA1C 8.2 (H) 01/18/2022  ? HGBA1C 6.0 (A) 09/01/2021  ? ESRSEDRATE 19 (H) 02/14/2022  ? ESRSEDRATE 22 (H) 01/18/2022  ? CRP 14.5 (H) 02/14/2022  ? CRP 1.0 (H) 01/18/2022  ? REPTSTATUS 01/23/2022 FINAL 01/18/2022  ? REPTSTATUS 01/23/2022 FINAL 01/18/2022  ? GRAMSTAIN  02/17/2012  ?  FEW WBC PRESENT, PREDOMINANTLY PMN ?NO SQUAMOUS EPITHELIAL CELLS SEEN ?MODERATE GRAM POSITIVE COCCI IN PAIRS ?FEW GRAM NEGATIVE RODS  ? CULT  01/18/2022  ?  NO GROWTH 5 DAYS ?Performed at [facility], [address], [place], [place] [address] ?  ? CULT  01/18/2022  ?  NO GROWTH  5 DAYS ?Performed at [facility], [address], [place], [place] [address] ?  ? ? ? ?Lab Results  ?Component Value Date  ? ALBUMIN 3.8 12/01/2019  ? ALBUMIN 3.7 06/23/2009  ? ? ?No results found for: MG ?No results found for: VD25OH ? ?No results found for: PREALBUMIN ?CBC EXTENDED Latest Ref Rng & Units 01/20/2022 01/19/2022 01/18/2022  ?WBC 4.0 - 10.5 K/uL 8.3 8.7 9.5  ?RBC 4.22 - 5.81 MIL/uL 4.91 5.06 5.32  ?HGB 13.0 - 17.0 g/dL 26.3 33.5 45.6  ?HCT 39.0 - 52.0 % 41.8 44.3 46.1  ?PLT 150 - 400 K/uL 207 208 226  ?NEUTROABS 1.7 - 7.7 K/uL - - 6.3  ?LYMPHSABS 0.7 - 4.0 K/uL - - 2.2  ? ? ? ?There is no height or weight on file to calculate BMI. ? ?Orders:  ?No orders of the defined types were placed in this encounter. ? ?No orders of the defined types were placed in this encounter. ? ? ? Procedures: ?No procedures performed ? ?Clinical Data: ?No additional findings. ? ?ROS: ? ?All other systems negative, except as noted in the HPI. ?Review of Systems ? ?Objective: ?Vital Signs: There were no vitals taken for this visit. ? ?Specialty Comments:  ?No specialty comments available. ? ?PMFS History: ?Patient Active Problem List  ?  Diagnosis Date Noted  ? Toe osteomyelitis, left (HCC) 01/18/2022  ? DM (diabetes mellitus) (HCC) 01/18/2022  ? Amputated toe of left foot (HCC) 01/18/2022  ? Osteomyelitis of great toe of left foot (HCC)   ? ?Past Medical History:  ?Diagnosis Date  ? COVID-19 2020  ? Diabetes mellitus without complication (HCC)   ? type 2  ? Dyspnea   ? wtih exertion  ? History of kidney stones   ? Peripheral neuropathy   ?  ?Family History  ?Problem Relation Age of Onset  ? Diabetes Mother   ? Diabetes Father   ? Diabetes Brother   ?  ?Past Surgical History:  ?Procedure Laterality Date  ? AMPUTATION Left 04/29/2021  ? Procedure: LEFT GREAT TOE AMPUTATION;  Surgeon: [name], MD;  Location[facility];  Service: Orthopedics;  Laterality: Left;  ? MULTIPLE TOOTH EXTRACTIONS    ? ?Social History  ? ?Occupational  History  ? Not on file  ?Tobacco Use  ? Smoking status: Never  ? Smokeless tobacco: Never  ?Vaping Use  ? Vaping Use: Never used  ?Substance and Sexual Activity  ? Alcohol use: Not Currently  ? Drug use: Never  ? Sexual activity: Not on file  ? ? ? ? ? ?

## 2022-03-20 ENCOUNTER — Encounter: Payer: Self-pay | Admitting: Internal Medicine

## 2022-03-20 ENCOUNTER — Ambulatory Visit (INDEPENDENT_AMBULATORY_CARE_PROVIDER_SITE_OTHER): Payer: 59 | Admitting: Internal Medicine

## 2022-03-20 ENCOUNTER — Other Ambulatory Visit: Payer: Self-pay

## 2022-03-20 VITALS — BP 135/84 | HR 83 | Temp 98.4°F | Ht 74.0 in | Wt 258.0 lb

## 2022-03-20 DIAGNOSIS — E0841 Diabetes mellitus due to underlying condition with diabetic mononeuropathy: Secondary | ICD-10-CM | POA: Diagnosis not present

## 2022-03-20 DIAGNOSIS — M869 Osteomyelitis, unspecified: Secondary | ICD-10-CM | POA: Diagnosis not present

## 2022-03-20 NOTE — Progress Notes (Signed)
?RFV: follow up on January hospitalization for cellulitis/septic arthritis ? ?Patient ID: John Church, male   DOB: 01-08-1976, 46 y.o.   MRN: [id number] ? ?HPI ?Great toe of left foot amputation in 2022 but started to have worsening discoloration, cellulitis, thsu admitted in lat January and was started on vancomycin for cellulitis. His WBC 8.7, sed rate of 22. MRI did show distal phalynx of 4th digit having signs of osteo but also 3rd digit PIP septic arthritis and cellulitis changes. He was initially on vancomycin but he was discharged with 2 weekly doses of dalbavancin plus he took augmentin since he completed augmentin up until 2-3 wks ago. He has no redness or drainage, nor new ulcers. No fever or chills. No diarrhea after stopping abtx. Now having normal bowel movements. Has another appt in 2 months. In feb, in crp 14 and sed rate 21.  ? ?Outpatient Encounter Medications as of 03/20/2022  ?Medication Sig  ? glipiZIDE (GLUCOTROL XL) 5 MG 24 hr tablet Take 1 tablet (5 mg total) by mouth daily with breakfast.  ? glucose blood (ACCU-CHEK GUIDE) test strip Use as instructed to monitor glucose 4 times daily  ? metFORMIN (GLUCOPHAGE) 500 MG tablet Take 2 tablets by mouth twice daily  ? ONGLYZA 5 MG TABS tablet Take 1 tablet (5 mg total) by mouth every evening.  ? rosuvastatin (CRESTOR) 5 MG tablet Take 5 mg by mouth daily.  ? amoxicillin-clavulanate (AUGMENTIN) 500-125 MG tablet Take 1 tablet (500 mg total) by mouth 3 (three) times daily. (Patient not taking: Reported on 03/20/2022)  ? calcium carbonate (TUMS - DOSED IN MG ELEMENTAL CALCIUM) 500 MG chewable tablet Chew 1 tablet by mouth daily. (Patient not taking: Reported on 02/14/2022)  ? sulfamethoxazole-trimethoprim (BACTRIM DS) 800-160 MG tablet Take 1 tablet by mouth every 12 (twelve) hours. (Patient not taking: Reported on 02/14/2022)  ? ?No facility-administered encounter medications on file as of 03/20/2022.  ?  ? ?Patient Active Problem List  ? Diagnosis  Date Noted  ? Toe osteomyelitis, left (HCC) 01/18/2022  ? DM (diabetes mellitus) (HCC) 01/18/2022  ? Amputated toe of left foot (HCC) 01/18/2022  ? Osteomyelitis of great toe of left foot (HCC)   ? ? ? ?Health Maintenance Due  ?Topic Date Due  ? COVID-19 Vaccine (1) Never done  ? FOOT EXAM  Never done  ? OPHTHALMOLOGY EXAM  Never done  ? URINE MICROALBUMIN  Never done  ? Hepatitis C Screening  Never done  ? TETANUS/TDAP  Never done  ? COLONOSCOPY (Pts 45-37yrs Insurance coverage will need to be confirmed)  Never done  ? INFLUENZA VACCINE  Never done  ?  ? ?Review of Systems ?Review of Systems  ?Constitutional: Negative for fever, chills, diaphoresis, activity change, appetite change, fatigue and unexpected weight change.  ?HENT: Negative for congestion, sore throat, rhinorrhea, sneezing, trouble swallowing and sinus pressure.  ?Eyes: Negative for photophobia and visual disturbance.  ?Respiratory: Negative for cough, chest tightness, shortness of breath, wheezing and stridor.  ?Cardiovascular: Negative for chest pain, palpitations and leg swelling.  ?Gastrointestinal: Negative for nausea, vomiting, abdominal pain, diarrhea, constipation, blood in stool, abdominal distention and anal bleeding.  ?Genitourinary: Negative for dysuria, hematuria, flank pain and difficulty urinating.  ?Musculoskeletal: Negative for myalgias, back pain, joint swelling, arthralgias and gait problem.  ?Skin: Negative for color change, pallor, rash and wound.  ?Neurological: Negative for dizziness, tremors, weakness and light-headedness.  ?Hematological: Negative for adenopathy. Does not bruise/bleed easily.  ?Psychiatric/Behavioral: Negative for behavioral problems, confusion,  sleep disturbance, dysphoric mood, decreased concentration and agitation.  ? ?Physical Exam  ? ?BP 135/84   Pulse 83   Temp 98.4 ?F (36.9 ?C) (Oral)   Ht 6\' 2"  (1.88 m)   Wt 258 lb (117 kg)   SpO2 99%   BMI 33.13 kg/m?   ?Physical Exam  ?Constitutional: He is  oriented to person, place, and time. He appears well-developed and well-nourished. No distress.  ?HENT:  ?Mouth/Throat: Oropharynx is clear and moist. No oropharyngeal exudate.  ?Skin= left foot amputation of great toe. No erythema or swelling no ulcers noted. ?Neurological: He is alert and oriented to person, place, and time.  ?Skin: Skin is warm and dry. No rash noted. No erythema.  ?Psychiatric: He has a normal mood and affect. His behavior is normal.  ? ? ? ?CBC ?Lab Results  ?Component Value Date  ? WBC 8.3 01/20/2022  ? RBC 4.91 01/20/2022  ? HGB 14.5 01/20/2022  ? HCT 41.8 01/20/2022  ? PLT 207 01/20/2022  ? MCV 85.1 01/20/2022  ? MCH 29.5 01/20/2022  ? MCHC 34.7 01/20/2022  ? RDW 11.9 01/20/2022  ? LYMPHSABS 2.2 01/18/2022  ? MONOABS 0.6 01/18/2022  ? EOSABS 0.4 01/18/2022  ? ? ?BMET ?Lab Results  ?Component Value Date  ? NA 136 01/20/2022  ? K 3.8 01/20/2022  ? CL 104 01/20/2022  ? CO2 24 01/20/2022  ? GLUCOSE 174 (H) 01/20/2022  ? BUN 10 01/20/2022  ? CREATININE 0.79 01/20/2022  ? CALCIUM 8.5 (L) 01/20/2022  ? GFRNONAA >60 01/20/2022  ? GFRAA >60 12/01/2019  ? ? ? ? ?Assessment and Plan ? ?Cellulitis and septic arthritis to dip of left foot = finished course of abtx. Monitoring off of abtx for now. ?Will check sed rate and crp to see if normalizing. If going down no further abtx but if increase will need to re address restarting abtx ? ?T2dm with peripheral neuropathy = continue with good control of DM on his current regimen. ? ?

## 2022-03-21 LAB — C-REACTIVE PROTEIN: CRP: 17.3 mg/L — ABNORMAL HIGH (ref <8.0)

## 2022-03-21 LAB — SEDIMENTATION RATE: Sed Rate: 17 mm/h — ABNORMAL HIGH (ref 0–15)

## 2022-04-04 ENCOUNTER — Other Ambulatory Visit: Payer: Self-pay | Admitting: "Endocrinology

## 2022-04-07 ENCOUNTER — Other Ambulatory Visit: Payer: Self-pay | Admitting: "Endocrinology

## 2022-04-13 ENCOUNTER — Other Ambulatory Visit: Payer: Self-pay | Admitting: "Endocrinology

## 2022-04-27 ENCOUNTER — Telehealth: Payer: Self-pay | Admitting: Nurse Practitioner

## 2022-04-27 MED ORDER — METFORMIN HCL 500 MG PO TABS: 1000.0000 mg | ORAL_TABLET | Freq: Two times a day (BID) | ORAL | 0 refills | Status: DC

## 2022-04-27 NOTE — Telephone Encounter (Signed)
done

## 2022-04-27 NOTE — Telephone Encounter (Signed)
Can you send in a refill for his metformin, [place] [facility] ?

## 2022-05-01 ENCOUNTER — Encounter: Payer: Self-pay | Admitting: Orthopedic Surgery

## 2022-05-01 ENCOUNTER — Ambulatory Visit (INDEPENDENT_AMBULATORY_CARE_PROVIDER_SITE_OTHER): Payer: 59 | Admitting: Orthopedic Surgery

## 2022-05-01 DIAGNOSIS — S98112A Complete traumatic amputation of left great toe, initial encounter: Secondary | ICD-10-CM

## 2022-05-01 DIAGNOSIS — Z89412 Acquired absence of left great toe: Secondary | ICD-10-CM

## 2022-05-01 NOTE — Progress Notes (Signed)
? ?Office Visit Note ?  ?Patient: John Church           ?Date of Birth: [birth year]           ?MRN: [id number] ?Visit Date: 05/01/2022 ?             ?Requested by: Lovey Newcomer, PA ?[address] ?[place],  [place] [address] ?PCP: Lovey Newcomer, PA ? ?Chief Complaint  ?Patient presents with  ? Left Foot - Follow-up  ?  Left foot 3rd toe osteo  ? ? ? ? ?HPI: ?Patient is a 46 year old gentleman who presents in follow-up for the left foot he is status post great toe amputation.  Patient feels like the redness swelling and ulceration to the toes on the left foot has resolved. ? ?Assessment & Plan: ?Visit Diagnoses:  ?1. Amputated great toe of left foot (HCC)   ? ? ?Plan: Patient will advance activities as tolerated with a extra-depth sneaker. ? ?Follow-Up Instructions: Return in about 3 months (around 08/01/2022).  ? ?Ortho Exam ? ?Patient is alert, oriented, no adenopathy, well-dressed, normal affect, normal respiratory effort. ?Examination patient has a good dorsalis pedis pulse the great toe amputation is well-healed the toes show no sausage digit swelling no redness no cellulitis no ulceration. ? ?Imaging: ?No results found. ?No images are attached to the encounter. ? ?Labs: ?Lab Results  ?Component Value Date  ? HGBA1C 8.2 (H) 01/18/2022  ? HGBA1C 6.0 (A) 09/01/2021  ? ESRSEDRATE 17 (H) 03/20/2022  ? ESRSEDRATE 19 (H) 02/14/2022  ? ESRSEDRATE 22 (H) 01/18/2022  ? CRP 17.3 (H) 03/20/2022  ? CRP 14.5 (H) 02/14/2022  ? CRP 1.0 (H) 01/18/2022  ? REPTSTATUS 01/23/2022 FINAL 01/18/2022  ? REPTSTATUS 01/23/2022 FINAL 01/18/2022  ? GRAMSTAIN  02/17/2012  ?  FEW WBC PRESENT, PREDOMINANTLY PMN ?NO SQUAMOUS EPITHELIAL CELLS SEEN ?MODERATE GRAM POSITIVE COCCI IN PAIRS ?FEW GRAM NEGATIVE RODS  ? CULT  01/18/2022  ?  NO GROWTH 5 DAYS ?Performed at [facility], [address] [place], [place] [address] ?  ? CULT  01/18/2022  ?  NO GROWTH 5 DAYS ?Performed at [facility], [address], [place], [place] [address] ?  ? ? ? ?Lab  Results  ?Component Value Date  ? ALBUMIN 3.8 12/01/2019  ? ALBUMIN 3.7 06/23/2009  ? ? ?No results found for: MG ?No results found for: VD25OH ? ?No results found for: PREALBUMIN ? ?  Latest Ref Rng & Units 01/20/2022  ?  2:07 AM 01/19/2022  ?  3:57 AM 01/18/2022  ?  2:23 PM  ?CBC EXTENDED  ?WBC 4.0 - 10.5 K/uL 8.3   8.7   9.5    ?RBC 4.22 - 5.81 MIL/uL 4.91   5.06   5.32    ?Hemoglobin 13.0 - 17.0 g/dL 16.6   06.3   01.6    ?HCT 39.0 - 52.0 % 41.8   44.3   46.1    ?Platelets 150 - 400 K/uL 207   208   226    ?NEUT# 1.7 - 7.7 K/uL   6.3    ?Lymph# 0.7 - 4.0 K/uL   2.2    ? ? ? ?There is no height or weight on file to calculate BMI. ? ?Orders:  ?No orders of the defined types were placed in this encounter. ? ?No orders of the defined types were placed in this encounter. ? ? ? Procedures: ?No procedures performed ? ?Clinical Data: ?No additional findings. ? ?ROS: ? ?All  other systems negative, except as noted in the HPI. ?Review of Systems ? ?Objective: ?Vital Signs: There were no vitals taken for this visit. ? ?Specialty Comments:  ?No specialty comments available. ? ?PMFS History: ?Patient Active Problem List  ? Diagnosis Date Noted  ? Toe osteomyelitis, left (HCC) 01/18/2022  ? DM (diabetes mellitus) (HCC) 01/18/2022  ? Amputated toe of left foot (HCC) 01/18/2022  ? Osteomyelitis of great toe of left foot (HCC)   ? ?Past Medical History:  ?Diagnosis Date  ? COVID-19 2020  ? Diabetes mellitus without complication (HCC)   ? type 2  ? Dyspnea   ? wtih exertion  ? History of kidney stones   ? Peripheral neuropathy   ?  ?Family History  ?Problem Relation Age of Onset  ? Diabetes Mother   ? Diabetes Father   ? Diabetes Brother   ?  ?Past Surgical History:  ?Procedure Laterality Date  ? AMPUTATION Left 04/29/2021  ? Procedure: LEFT GREAT TOE AMPUTATION;  Surgeon: [name], MD;  Location: [facility] OR;  Service: Orthopedics;  Laterality: Left;  ? MULTIPLE TOOTH EXTRACTIONS    ? ?Social History  ? ?Occupational History  ? Not on  file  ?Tobacco Use  ? Smoking status: Never  ? Smokeless tobacco: Never  ?Vaping Use  ? Vaping Use: Never used  ?Substance and Sexual Activity  ? Alcohol use: Not Currently  ? Drug use: Never  ? Sexual activity: Not on file  ? ? ? ? ? ?

## 2022-05-12 ENCOUNTER — Other Ambulatory Visit: Payer: Self-pay | Admitting: Nurse Practitioner

## 2022-05-29 ENCOUNTER — Ambulatory Visit (INDEPENDENT_AMBULATORY_CARE_PROVIDER_SITE_OTHER): Payer: 59 | Admitting: Nurse Practitioner

## 2022-05-29 ENCOUNTER — Encounter: Payer: Self-pay | Admitting: Nurse Practitioner

## 2022-05-29 VITALS — BP 149/83 | HR 97 | Ht 74.0 in | Wt 261.0 lb

## 2022-05-29 DIAGNOSIS — I10 Essential (primary) hypertension: Secondary | ICD-10-CM

## 2022-05-29 DIAGNOSIS — E782 Mixed hyperlipidemia: Secondary | ICD-10-CM | POA: Diagnosis not present

## 2022-05-29 DIAGNOSIS — E1165 Type 2 diabetes mellitus with hyperglycemia: Secondary | ICD-10-CM

## 2022-05-29 LAB — POCT GLYCOSYLATED HEMOGLOBIN (HGB A1C): HbA1c POC (<> result, manual entry): 8.6 % (ref 4.0–5.6)

## 2022-05-29 MED ORDER — GLIPIZIDE ER 5 MG PO TB24: 5.0000 mg | ORAL_TABLET | Freq: Every day | ORAL | 3 refills | Status: DC

## 2022-05-29 MED ORDER — METFORMIN HCL 500 MG PO TABS: 1000.0000 mg | ORAL_TABLET | Freq: Two times a day (BID) | ORAL | 3 refills | Status: DC

## 2022-05-29 MED ORDER — ONGLYZA 5 MG PO TABS: 5.0000 mg | ORAL_TABLET | Freq: Every evening | ORAL | 3 refills | Status: DC

## 2022-05-29 NOTE — Patient Instructions (Signed)
Diabetes Mellitus and Foot Care Foot care is an important part of your health, especially when you have diabetes. Diabetes may cause you to have problems because of poor blood flow (circulation) to your feet and legs, which can cause your skin to: Become thinner and drier. Break more easily. Heal more slowly. Peel and crack. You may also have nerve damage (neuropathy) in your legs and feet, causing decreased feeling in them. This means that you may not notice minor injuries to your feet that could lead to more serious problems. Noticing and addressing any potential problems early is the best way to prevent future foot problems. How to care for your feet Foot hygiene  Wash your feet daily with warm water and mild soap. Do not use hot water. Then, pat your feet and the areas between your toes until they are completely dry. Do not soak your feet as this can dry your skin. Trim your toenails straight across. Do not dig under them or around the cuticle. File the edges of your nails with an emery board or nail file. Apply a moisturizing lotion or petroleum jelly to the skin on your feet and to dry, brittle toenails. Use lotion that does not contain alcohol and is unscented. Do not apply lotion between your toes. Shoes and socks Wear clean socks or stockings every day. Make sure they are not too tight. Do not wear knee-high stockings since they may decrease blood flow to your legs. Wear shoes that fit properly and have enough cushioning. Always look in your shoes before you put them on to be sure there are no objects inside. To break in new shoes, wear them for just a few hours a day. This prevents injuries on your feet. Wounds, scrapes, corns, and calluses  Check your feet daily for blisters, cuts, bruises, sores, and redness. If you cannot see the bottom of your feet, use a mirror or ask someone for help. Do not cut corns or calluses or try to remove them with medicine. If you find a minor scrape,  cut, or break in the skin on your feet, keep it and the skin around it clean and dry. You may clean these areas with mild soap and water. Do not clean the area with peroxide, alcohol, or iodine. If you have a wound, scrape, corn, or callus on your foot, look at it several times a day to make sure it is healing and not infected. Check for: Redness, swelling, or pain. Fluid or blood. Warmth. Pus or a bad smell. General tips Do not cross your legs. This may decrease blood flow to your feet. Do not use heating pads or hot water bottles on your feet. They may burn your skin. If you have lost feeling in your feet or legs, you may not know this is happening until it is too late. Protect your feet from hot and cold by wearing shoes, such as at the beach or on hot pavement. Schedule a complete foot exam at least once a year (annually) or more often if you have foot problems. Report any cuts, sores, or bruises to your health care provider immediately. Where to find more information American Diabetes Association: www.diabetes.org Association of Diabetes Care & Education Specialists: www.diabeteseducator.org Contact a health care provider if: You have a medical condition that increases your risk of infection and you have any cuts, sores, or bruises on your feet. You have an injury that is not healing. You have redness on your legs or feet. You   feel burning or tingling in your legs or feet. You have pain or cramps in your legs and feet. Your legs or feet are numb. Your feet always feel cold. You have pain around any toenails. Get help right away if: You have a wound, scrape, corn, or callus on your foot and: You have pain, swelling, or redness that gets worse. You have fluid or blood coming from the wound, scrape, corn, or callus. Your wound, scrape, corn, or callus feels warm to the touch. You have pus or a bad smell coming from the wound, scrape, corn, or callus. You have a fever. You have a red  line going up your leg. Summary Check your feet every day for blisters, cuts, bruises, sores, and redness. Apply a moisturizing lotion or petroleum jelly to the skin on your feet and to dry, brittle toenails. Wear shoes that fit properly and have enough cushioning. If you have foot problems, report any cuts, sores, or bruises to your health care provider immediately. Schedule a complete foot exam at least once a year (annually) or more often if you have foot problems. This information is not intended to replace advice given to you by your health care provider. Make sure you discuss any questions you have with your health care provider. Document Revised: 07/01/2020 Document Reviewed: 07/01/2020 Elsevier Patient Education  2023 Elsevier Inc.  

## 2022-05-29 NOTE — Progress Notes (Signed)
Endocrinology Follow Up Note       05/29/2022, 11:54 AM   Subjective:    Patient ID: John Church, male    DOB: March 03, 1976.  KYLOR HORIGAN is being seen in follow up after being seen in consultation for management of currently uncontrolled symptomatic diabetes requested by  Lavella Lemons, PA.   Past Medical History:  Diagnosis Date   COVID-19 2020   Diabetes mellitus without complication ([place])    type 2   Dyspnea    wtih exertion   History of kidney stones    Peripheral neuropathy     Past Surgical History:  Procedure Laterality Date   AMPUTATION Left 04/29/2021   Procedure: LEFT GREAT TOE AMPUTATION;  Surgeon: Newt Minion, MD;  Location: [facility];  Service: Orthopedics;  Laterality: Left;   MULTIPLE TOOTH EXTRACTIONS      Social History   Socioeconomic History   Marital status: Single    Spouse name: Not on file   Number of children: Not on file   Years of education: Not on file   Highest education level: Not on file  Occupational History   Not on file  Tobacco Use   Smoking status: Never   Smokeless tobacco: Never  Vaping Use   Vaping Use: Never used  Substance and Sexual Activity   Alcohol use: Not Currently   Drug use: Never   Sexual activity: Not on file  Other Topics Concern   Not on file  Social History Narrative   Not on file   Social Determinants of Health   Financial Resource Strain: Not on file  Food Insecurity: Not on file  Transportation Needs: Not on file  Physical Activity: Not on file  Stress: Not on file  Social Connections: Not on file    Family History  Problem Relation Age of Onset   Diabetes Mother    Diabetes Father    Diabetes Brother     Outpatient Encounter Medications as of 05/29/2022  Medication Sig   rosuvastatin (CRESTOR) 5 MG tablet Take 5 mg by mouth daily.   [DISCONTINUED] glipiZIDE (GLUCOTROL XL) 5 MG 24 hr tablet Take 1 tablet (5 mg total) by  mouth daily with breakfast.   [DISCONTINUED] metFORMIN (GLUCOPHAGE) 500 MG tablet Take 2 tablets (1,000 mg total) by mouth 2 (two) times daily.   [DISCONTINUED] ONGLYZA 5 MG TABS tablet Take 1 tablet (5 mg total) by mouth every evening.   glipiZIDE (GLUCOTROL XL) 5 MG 24 hr tablet Take 1 tablet (5 mg total) by mouth daily with breakfast.   glucose blood (ACCU-CHEK GUIDE) test strip Use as instructed to monitor glucose 4 times daily   metFORMIN (GLUCOPHAGE) 500 MG tablet Take 2 tablets (1,000 mg total) by mouth 2 (two) times daily.   ONGLYZA 5 MG TABS tablet Take 1 tablet (5 mg total) by mouth every evening.   [DISCONTINUED] amoxicillin-clavulanate (AUGMENTIN) 500-125 MG tablet Take 1 tablet (500 mg total) by mouth 3 (three) times daily. (Patient not taking: Reported on 03/20/2022)   [DISCONTINUED] calcium carbonate (TUMS - DOSED IN MG ELEMENTAL CALCIUM) 500 MG chewable tablet Chew 1 tablet by mouth daily. (Patient not taking: Reported on  02/14/2022)   [DISCONTINUED] sulfamethoxazole-trimethoprim (BACTRIM DS) 800-160 MG tablet Take 1 tablet by mouth every 12 (twelve) hours. (Patient not taking: Reported on 02/14/2022)   No facility-administered encounter medications on file as of 05/29/2022.    ALLERGIES: Allergies  Allergen Reactions   Penicillins     Did it involve swelling of the face/tongue/throat, SOB, or low BP? Unknown Did it involve sudden or severe rash/hives, skin peeling, or any reaction on the inside of your mouth or nose? Unknown Did you need to seek medical attention at a hospital or doctor's office? Unknown When did it last happen? 46 year old       If all above answers are "NO", may proceed with cephalosporin use.   CHILDHOOD ALLERGY  Pt states he has recently had penicillin in the hospital, and states he is no longer allergic.    VACCINATION STATUS: There is no immunization history for the selected administration types on file for this patient.  Diabetes He presents for  his follow-up diabetic visit. He has type 2 diabetes mellitus. Onset time: diagnosed at approx age of 46. His disease course has been worsening. There are no hypoglycemic associated symptoms. Associated symptoms include fatigue. Pertinent negatives for diabetes include no polydipsia, no polyuria and no weight loss. There are no hypoglycemic complications. Symptoms are stable. 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 Current diabetic treatment includes oral agent (triple therapy). He is compliant with treatment most of the time (recently ran out of Glipizide). His weight is increasing steadily. He is following a generally unhealthy diet. Meal planning includes avoidance of concentrated sweets and ADA exchanges. He has not had a previous visit with a dietitian. He rarely participates in exercise. His overall blood glucose range is >200 mg/dl. (He presents today with his meter, no logs, showing inconsistent glucose monitoring (only has 2 readings since the beginning of the year).  His POCT A1c today is 8.6%, increasing from last visit of 8.2%.  He reports he ran out of his Glipizide about 2 weeks ago, did not call to have it refilled.  Analysis of his meter shows 7-day average of 224 (with 2 readings).  He denies any hypoglycemia.) An ACE inhibitor/angiotensin II receptor blocker is not being taken. He does not see a podiatrist.Eye exam is not current.    Review of systems  Constitutional: + Minimally fluctuating body weight,  current Body mass index is 33.51 kg/m. , no fatigue, no subjective hyperthermia, no subjective hypothermia Eyes: no blurry vision, no xerophthalmia ENT: no sore throat, no nodules palpated in throat, no dysphagia/odynophagia, no hoarseness Cardiovascular: no chest pain, no shortness of breath, no palpitations, no leg swelling Respiratory: no cough, no  shortness of breath Gastrointestinal: no nausea/vomiting/diarrhea Musculoskeletal: no muscle/joint aches Skin: no rashes, no hyperemia Neurological: no tremors, no numbness, no tingling, no dizziness Psychiatric: no depression, no anxiety  Objective:     BP (!) 149/83   Pulse 97   Ht 6\' 2"  (1.88 m)   Wt 261 lb (118.4 kg)   BMI 33.51 kg/m   Wt Readings from Last 3 Encounters:  05/29/22 261 lb (118.4 kg)  03/20/22 258 lb (117 kg)  02/14/22 257 lb (116.6 kg)     BP Readings from Last 3 Encounters:  05/29/22 (!) 149/83  03/20/22 135/84  02/14/22 (!) 142/91      Physical Exam- Limited  Constitutional:  Body mass index is 33.51  kg/m. , not in acute distress, normal state of mind Eyes:  EOMI, no exophthalmos Neck: Supple Cardiovascular: RRR, no murmurs, rubs, or gallops, no edema Respiratory: Adequate breathing efforts, no crackles, rales, rhonchi, or wheezing Musculoskeletal: no gross deformities, strength intact in all four extremities, no gross restriction of joint movements Skin:  no rashes, no hyperemia Neurological: no tremor with outstretched hands   Diabetic Foot Exam - Simple   Simple Foot Form Diabetic Foot exam was performed with the following findings: Yes 05/29/2022 11:53 AM  Visual Inspection See comments: Yes Sensation Testing See comments: Yes Pulse Check Posterior Tibialis and Dorsalis pulse intact bilaterally: Yes Comments No sensation to monofilament tool bilaterally, small, chronic, pen point (dark scabbed area) to right anterior foot     CMP ( most recent) CMP     Component Value Date/Time   NA 136 01/20/2022 0207   K 3.8 01/20/2022 0207   CL 104 01/20/2022 0207   CO2 24 01/20/2022 0207   GLUCOSE 174 (H) 01/20/2022 0207   BUN 10 01/20/2022 0207   CREATININE 0.79 01/20/2022 0207   CALCIUM 8.5 (L) 01/20/2022 0207   PROT 8.4 (H) 12/01/2019 1717   ALBUMIN 3.8 12/01/2019 1717   AST 38 12/01/2019 1717   ALT 49 (H) 12/01/2019 1717   ALKPHOS  66 12/01/2019 1717   BILITOT 1.6 (H) 12/01/2019 1717   GFRNONAA >60 01/20/2022 0207   GFRAA >60 12/01/2019 1717     Diabetic Labs (most recent): Lab Results  Component Value Date   HGBA1C 8.6 05/29/2022   HGBA1C 8.2 (H) 01/18/2022   HGBA1C 6.0 (A) 09/01/2021     Lipid Panel ( most recent) Lipid Panel  No results found for: CHOL, TRIG, HDL, CHOLHDL, VLDL, LDLCALC, LDLDIRECT, LABVLDL    No results found for: TSH, FREET4         Assessment & Plan:   1) Type 2 diabetes mellitus with hyperglycemia, without long-term current use of insulin ([place])  He presents today with his meter, no logs, showing inconsistent glucose monitoring (only has 2 readings since the beginning of the year).  His POCT A1c today is 8.6%, increasing from last visit of 8.2%.  He reports he ran out of his Glipizide about 2 weeks ago, did not call to have it refilled.  Analysis of his meter shows 7-day average of 224 (with 2 readings).  He denies any hypoglycemia.  - John Church has currently uncontrolled symptomatic type 2 DM since 46 years of age.  -Recent labs reviewed.  - I had a long discussion with him about the progressive nature of diabetes and the pathology behind its complications. -his diabetes is not currently complicated but he remains at a high risk for more acute and chronic complications which include CAD, CVA, CKD, retinopathy, and neuropathy. These are all discussed in detail with him.  - Nutritional counseling repeated at each appointment due to patients tendency to fall back in to old habits.  - The patient admits there is a room for improvement in their diet and drink choices. -  Suggestion is made for the patient to avoid simple carbohydrates from their diet including Cakes, Sweet Desserts / Pastries, Ice Cream, Soda (diet and regular), Sweet Tea, Candies, Chips, Cookies, Sweet Pastries, Store Bought Juices, Alcohol in Excess of 1-2 drinks a day, Artificial Sweeteners, Coffee Creamer, and  "Sugar-free" Products. This will help patient to have stable blood glucose profile and potentially avoid unintended weight gain.   - I encouraged the patient  to switch to unprocessed or minimally processed complex starch and increased protein intake (animal or plant source), fruits, and vegetables.   - Patient is advised to stick to a routine mealtimes to eat 3 meals a day and avoid unnecessary snacks (to snack only to correct hypoglycemia).  - he will be scheduled with Norm Salt, RDN, CDE for diabetes education.  - I have approached him with the following individualized plan to manage  his diabetes and patient agrees:   -Given his lack of logs, no changes will be made to his medication regimen today.  He is advised to continue Metformin 1000 mg po twice daily with meals, Onglyza 5 mg po daily and restart his Glipizide 5 mg XL daily with breakfast (refill sent to pharmacy).   -He is encouraged to start consistently monitoring blood glucose 1-2 times daily, before breakfast and before bed, and to call the clinic if he has readings less than 70 or greater than 300 for 3 tests in a row.  - Adjustment parameters are given to him for hypo and hyperglycemia in writing.  - Specific targets for  A1c;  LDL, HDL,  and Triglycerides were discussed with the patient.  2) Blood Pressure /Hypertension:  his blood pressure is not controlled to target.  He is not currently on any antihypertensive medications at this time.  He will be considered for low dose ACE/ARB if BP elevated over 140/90 on 3 separate occasions.  3) Lipids/Hyperlipidemia:    Review of his recent lipid panel from 04/15/21 showed controlled LDL at 123 and elevated triglycerides of 168. He is advised to continue Crestor 5 mg po daily at bedtime.  Side effects and precautions discussed with him.  Will recheck lipid panel prior to next visit.  4)  Weight/Diet:  his Body mass index is 33.51 kg/m.  -    he is a candidate for some weight  loss. I discussed with him the fact that loss of 5 - 10% of his  current body weight will have the most impact on his diabetes management.  Exercise, and detailed carbohydrates information provided  -  detailed on discharge instructions.  5) Chronic Care/Health Maintenance: -he is not on ACEI/ARB and is on Statin medications and is encouraged to initiate and continue to follow up with Ophthalmology, Dentist, Podiatrist at least yearly or according to recommendations, and advised to stay away from smoking. I have recommended yearly flu vaccine and pneumonia vaccine at least every 5 years; moderate intensity exercise for up to 150 minutes weekly; and sleep for at least 7 hours a day.  - he is advised to maintain close follow up with Lovey Newcomer, PA for primary care needs, as well as his other providers for optimal and coordinated care.      I spent 40 minutes in the care of the patient today including review of labs from CMP, Lipids, Thyroid Function, Hematology (current and previous including abstractions from other facilities); face-to-face time discussing  his blood glucose readings/logs, discussing hypoglycemia and hyperglycemia episodes and symptoms, medications doses, his options of short and long term treatment based on the latest standards of care / guidelines;  discussion about incorporating lifestyle medicine;  and documenting the encounter.    Please refer to Patient Instructions for Blood Glucose Monitoring and Insulin/Medications Dosing Guide"  in media tab for additional information. Please  also refer to " Patient Self Inventory" in the Media  tab for reviewed elements of pertinent patient history.  John Church  participated in the discussions, expressed understanding, and voiced agreement with the above plans.  All questions were answered to his satisfaction. he is encouraged to contact clinic should he have any questions or concerns prior to his return visit.   Follow up  plan: - Return in about 3 months (around 08/29/2022) for Diabetes F/U with A1c in office, Bring meter and logs, Previsit labs.   Rayetta Pigg, [facility] [address] [place], [place] [address] Phone: [phone] Fax: [phone]  05/29/2022, 11:54 AM

## 2022-08-01 ENCOUNTER — Encounter: Payer: Self-pay | Admitting: Orthopedic Surgery

## 2022-08-01 ENCOUNTER — Ambulatory Visit (INDEPENDENT_AMBULATORY_CARE_PROVIDER_SITE_OTHER): Payer: 59 | Admitting: Orthopedic Surgery

## 2022-08-01 DIAGNOSIS — M869 Osteomyelitis, unspecified: Secondary | ICD-10-CM | POA: Diagnosis not present

## 2022-08-01 DIAGNOSIS — S98112A Complete traumatic amputation of left great toe, initial encounter: Secondary | ICD-10-CM

## 2022-08-01 DIAGNOSIS — Z89412 Acquired absence of left great toe: Secondary | ICD-10-CM

## 2022-08-01 NOTE — Progress Notes (Signed)
Office Visit Note   Patient: John Church           Date of Birth: 22-Mar-1976           MRN: [id number] Visit Date: 08/01/2022              Requested by: Lovey Newcomer, PA [address] [place],  [place] [address] PCP: Lovey Newcomer, [place]  Chief Complaint  Patient presents with   Left Foot - Follow-up    Left foot 3rd toe osteo      HPI: Patient is a 46 year old gentleman status post left foot great toe amputation previously had redness and swelling of the third toe.  Patient states that occasionally he has some flareups but otherwise his toe is doing fine.  Assessment & Plan: Visit Diagnoses:  1. 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 Osteomyelitis of third toe of left foot (HCC)     Plan: Follow-up as needed.  Patient was given instructions for Achilles stretching.  Follow-Up Instructions: Return if symptoms worsen or fail to improve.   Ortho Exam  Patient is alert, oriented, no adenopathy, well-dressed, normal affect, normal respiratory effort. Examination patient is a good dorsalis pedis pulse he has dorsiflexion to neutral.  There are no plantar ulcers or calluses the great toe amputation is well-healed.  There is no cellulitis or swelling of the toes no redness no tenderness to palpation.  Imaging: No results found. No images are attached to the encounter.  Labs: Lab Results  Component Value Date   HGBA1C 8.6 05/29/2022   HGBA1C 8.2 (H) 01/18/2022   HGBA1C 6.0 (A) 09/01/2021   ESRSEDRATE 17 (H) 03/20/2022   ESRSEDRATE 19 (H) 02/14/2022   ESRSEDRATE 22 (H) 01/18/2022   CRP 17.3 (H) 03/20/2022   CRP 14.5 (H) 02/14/2022   CRP 1.0 (H) 01/18/2022   REPTSTATUS 01/23/2022 FINAL 01/18/2022   REPTSTATUS 01/23/2022 FINAL 01/18/2022   GRAMSTAIN  02/17/2012    FEW WBC PRESENT, PREDOMINANTLY PMN NO SQUAMOUS EPITHELIAL CELLS SEEN MODERATE GRAM POSITIVE COCCI IN PAIRS FEW GRAM NEGATIVE RODS   CULT  01/18/2022    NO GROWTH 5 DAYS Performed at [facility], [address], [place], [place] [address]    CULT  01/18/2022    NO GROWTH 5 DAYS Performed at [facility], [address] [place], [place] [address]      Lab Results  Component Value Date   ALBUMIN 3.8 12/01/2019   ALBUMIN 3.7 06/23/2009    No results found for: "MG" No results found for: "VD25OH"  No results found for: "PREALBUMIN"    Latest Ref Rng & Units 01/20/2022    2:07 AM 01/19/2022    3:57 AM 01/18/2022    2:23 PM  CBC EXTENDED  WBC 4.0 - 10.5 K/uL 8.3  8.7  9.5   RBC 4.22 - 5.81 MIL/uL 4.91  5.06  5.32   Hemoglobin 13.0 - 17.0 g/dL 03.8  33.3  83.2   HCT 39.0 - 52.0 % 41.8  44.3  46.1   Platelets 150 - 400 K/uL 207  208  226   NEUT# 1.7 - 7.7 K/uL   6.3   Lymph# 0.7 - 4.0 K/uL   2.2      There is no height or weight on file to calculate BMI.  Orders:  No orders of the defined types were placed in this encounter.  No orders of the defined types were placed in this encounter.  Procedures: No procedures performed  Clinical Data: No additional findings.  ROS:  All other systems negative, except as noted in the HPI. Review of Systems  Objective: Vital Signs: There were no vitals taken for this visit.  Specialty Comments:  No specialty comments available.  PMFS History: Patient Active Problem List   Diagnosis Date Noted   Toe osteomyelitis, left (HCC) 01/18/2022   DM (diabetes mellitus) (HCC) 01/18/2022   Amputated toe of left foot (HCC) 01/18/2022   Osteomyelitis of great toe of left foot (HCC)    Past Medical History:  Diagnosis Date   COVID-19 2020   Diabetes mellitus without complication (HCC)    type 2   Dyspnea    wtih exertion   History of kidney stones    Peripheral neuropathy     Family History  Problem Relation Age of Onset   Diabetes Mother    Diabetes Father    Diabetes Brother     Past Surgical History:  Procedure Laterality Date   AMPUTATION Left 04/29/2021   Procedure: LEFT GREAT TOE AMPUTATION;  Surgeon:  [name], MD;  Location: [facility] OR;  Service: Orthopedics;  Laterality: Left;   MULTIPLE TOOTH EXTRACTIONS     Social History   Occupational History   Not on file  Tobacco Use   Smoking status: Never   Smokeless tobacco: Never  Vaping Use   Vaping Use: Never used  Substance and Sexual Activity   Alcohol use: Not Currently   Drug use: Never   Sexual activity: Not on file

## 2022-08-29 ENCOUNTER — Ambulatory Visit: Payer: 59 | Admitting: Nurse Practitioner

## 2022-09-15 ENCOUNTER — Ambulatory Visit: Payer: 59 | Admitting: Nurse Practitioner

## 2022-09-15 DIAGNOSIS — E782 Mixed hyperlipidemia: Secondary | ICD-10-CM

## 2022-09-15 DIAGNOSIS — E1165 Type 2 diabetes mellitus with hyperglycemia: Secondary | ICD-10-CM

## 2022-09-15 DIAGNOSIS — I10 Essential (primary) hypertension: Secondary | ICD-10-CM

## 2022-10-29 IMAGING — MR MR FOOT*L* WO/W CM
4 of 9 series · 16 of 40 positions shown · IV contrast (10 ML GAD)
Comparison: Multiple exams, including radiographs from 01/18/2022
and 04/25/2021

CLINICAL DATA: Osteomyelitis of the left foot. Prior great toe
amputation 04/29/2021. Diabetes.

EXAM:
MRI OF THE LEFT FOREFOOT WITHOUT AND WITH CONTRAST
TECHNIQUE: Multiplanar, multisequence MR imaging of the left foot was performed
both before and after administration of intravenous contrast.
Osteomyelitis protocol MRI of the foot was obtained, to include the
entire foot and ankle. This protocol uses a large field of view to
cover the entire foot and ankle, and is suitable for assessing bony
structures for osteomyelitis. Due to the large field of view and
imaging plane choice, this protocol is less sensitive for assessing
small structures such as ligamentous structures of the foot and
ankle, compared to a dedicated forefoot or dedicated hindfoot exam.
CONTRAST:  10mL GADAVIST GADOBUTROL 1 MMOL/ML IV SOLN

[Series 3: T1 · coronal · 3.0mm · 0.27mm/px · 4 of 73 slices shown (1 of 2)]
[im 1/73]
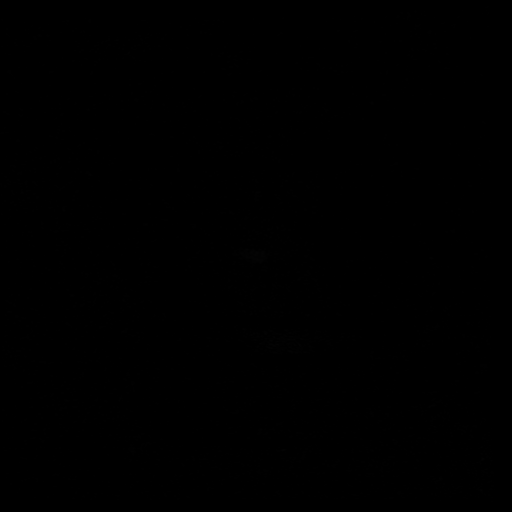
[im 13/73]
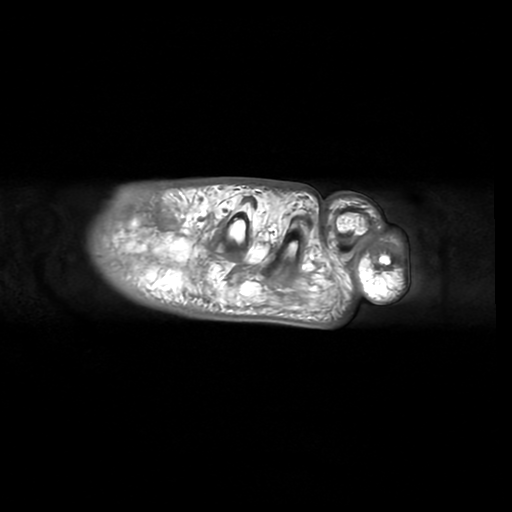
[im 37/73]
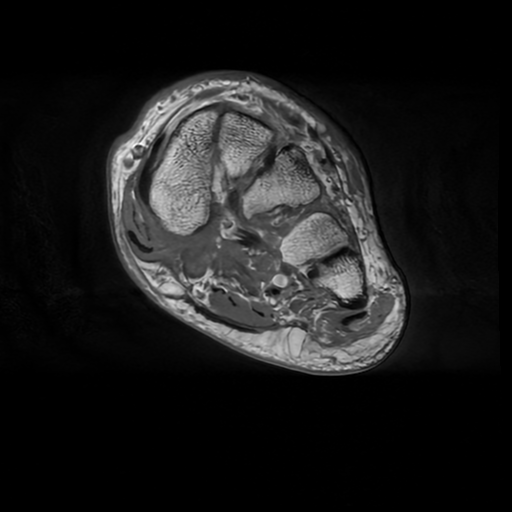
[im 61/73]
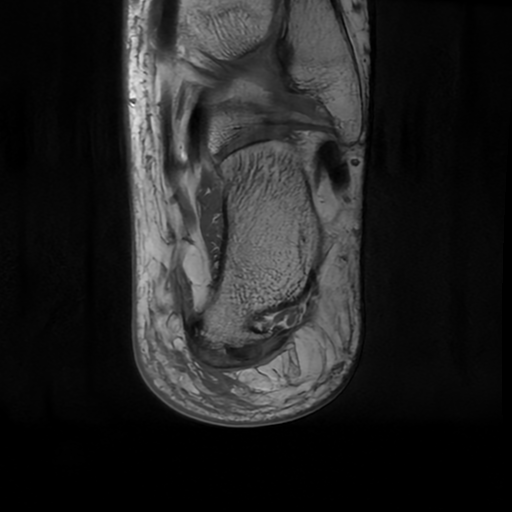

[Series 4: T1 fat-sat · coronal · non-contrast · 3.0mm · 0.27mm/px · 3 of 73 slices shown]
[im 15/73]
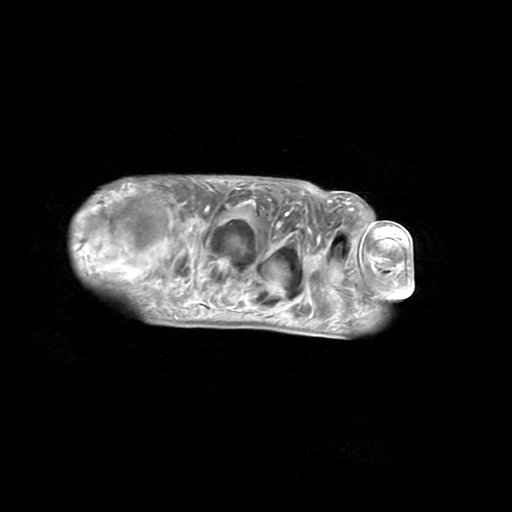
[im 44/73]
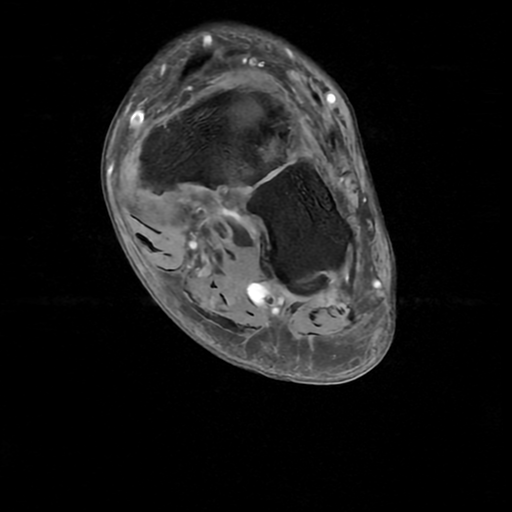
[im 73/73]
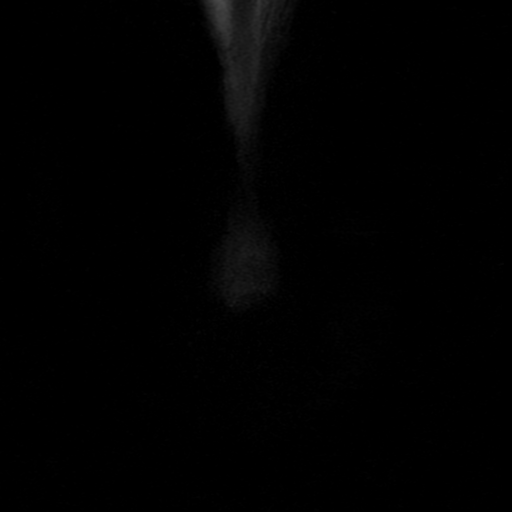

[Series 5: T2 fat-sat · coronal · 3.0mm · 0.27mm/px · 6 of 73 slices shown]
[im 1/73]
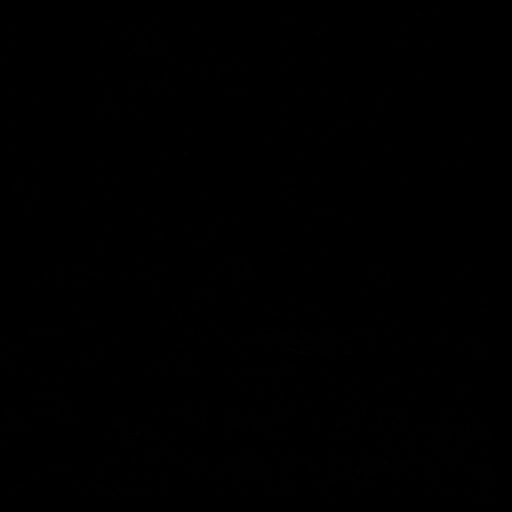
[im 15/73]
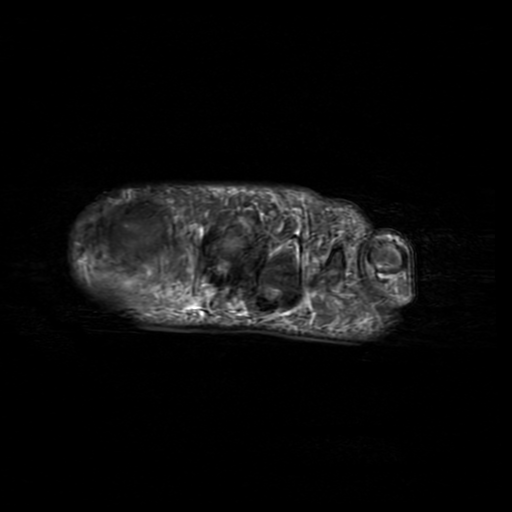
[im 29/73]
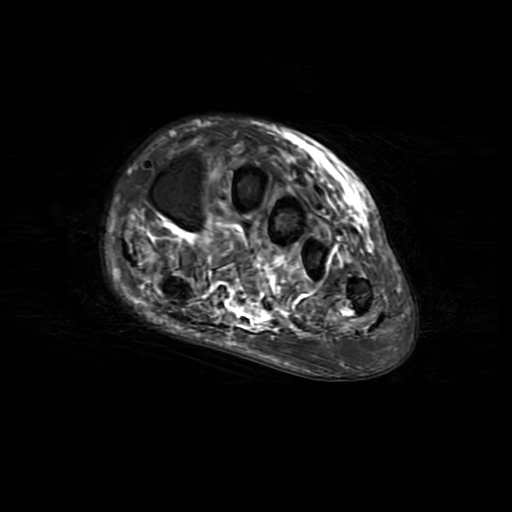
[im 44/73]
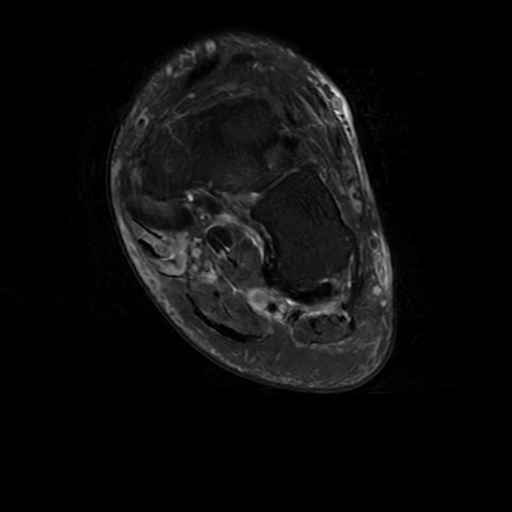
[im 58/73]
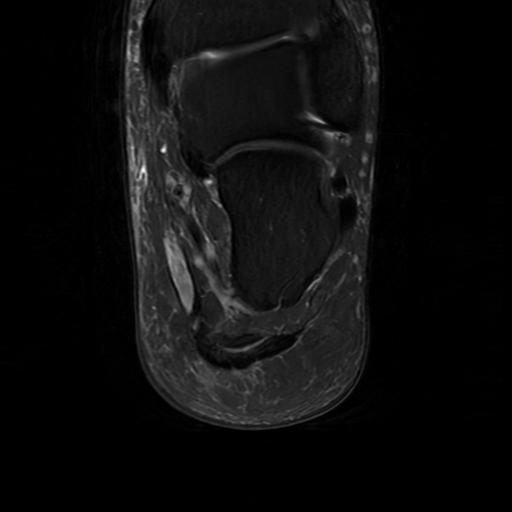
[im 73/73]
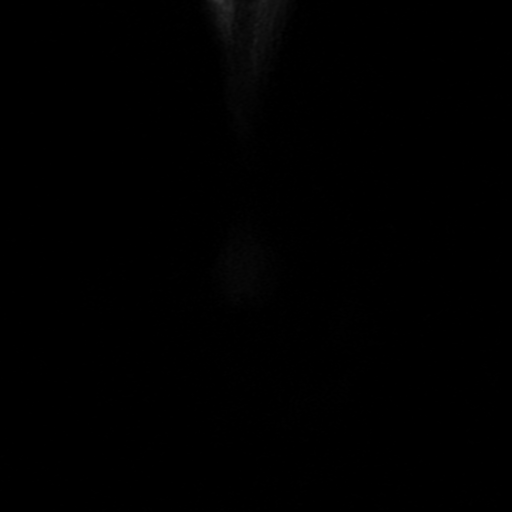

[Series 7: T1 · axial · 3.0mm · 0.55mm/px · z∈[-141,-38]mm · 3 of 32 slices shown (2 of 2)]
[im 1/32]
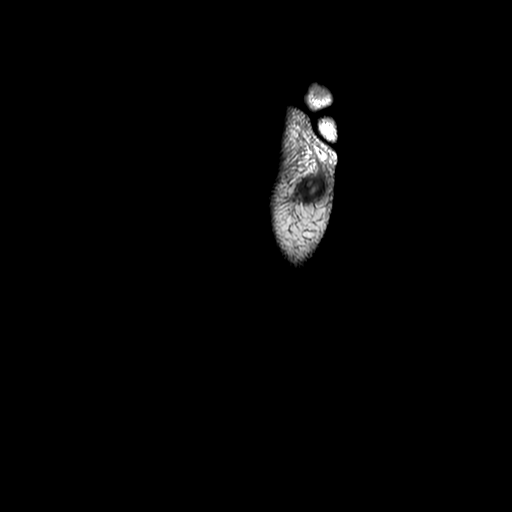
[im 16/32]
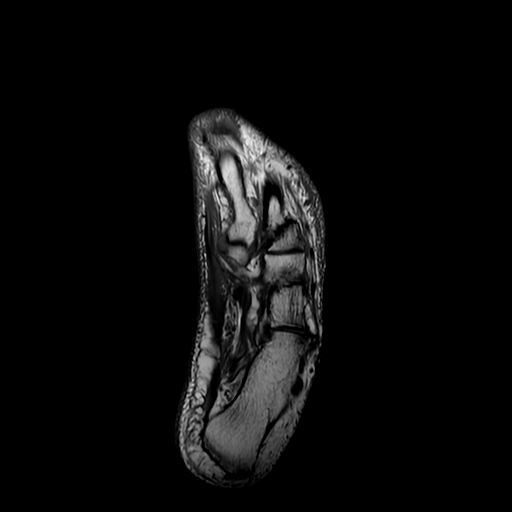
[im 32/32]
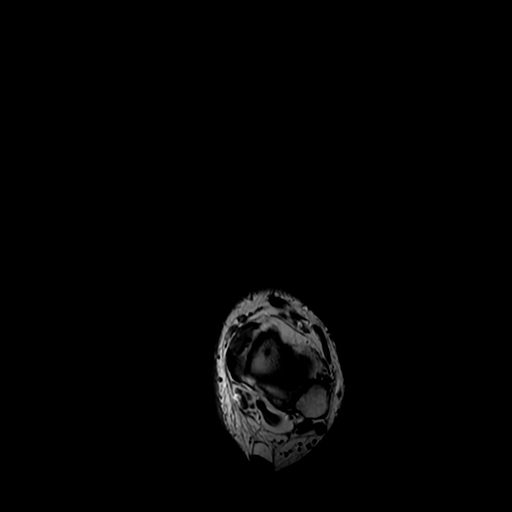

[16 of 40 positions shown; findings below may reference images not displayed]

FINDINGS: Bones/Joint/Cartilage

Abnormal scalloped anterior margin of the lateral portion of the
navicular subcortical low T2 signal with serpentine marginal T1
signal, appearance favors avascular necrosis and could have a
subacute component.

Similarly, there is abnormal marrow edema signal and enhancement in
the head of the second metatarsal with subcortical hypoenhancement
along the distal articular subcortical surface any manner suspicious
for avascular necrosis, image 9 series 10.

Small focus of edema signal and enhancement along the plantar margin
of the third metatarsal head for example on image 20 series 8 and
image 20 series 11, possibly degenerative but technically
nonspecific. Abnormal hand enhancement in this vicinity measures
about 0.6 by 0.3 cm and is notably less striking than in the second
metatarsal head.

There is abnormal edema and enhancement in the distal phalanx of the
fourth toe favoring osteomyelitis, with surrounding subcutaneous
edema.

In the third toe, which by report is relatively benign clinically,
there is abnormal edema and enhancement in the distal head of the
proximal phalanx and in the middle phalanx corresponding with the
progressive radiographic signs of bony demineralization along the
medial margin of the head of the proximal phalanx, and scalloping
and progressive volume loss in the medial proximal aspect of the
middle phalanx. This enhancement is well shown for example on images
5-6 of series 10, with accentuated T2 signal in the involved bones
shown on images 22 through 24 of series 8. The imaging appearance is
suspicious for septic proximal interphalangeal joint with
involvement of the head of the proximal phalanx and of the middle
phalanx, with gout or other erosive arthropathy considered less
likely in this clinical circumstance.

Prior amputation of the great toe at the MTP joint.

Ligaments

The large field of view precludes detailed ligamentous assessment.
The Lisfranc ligament appears grossly intact.

Muscles and Tendons

Low-level edema tracking within along plantar musculature and the
interosseous muscles, probably neurogenic rather than from
infectious myositis.

Thickened medial band of the plantar fascia proximally with adjacent
plantar calcaneal spur, suspicious for plantar fasciitis.

Distal tibialis posterior tendinopathy.

Soft tissues

Subcutaneous edema mild enhancement in the dorsal subcutaneous
tissues compatible with edema and potentially mild cellulitis.

Subcutaneous edema and enhancement particularly in the fourth toe.
Low-level infiltrative edema along the ball of the foot. No
drainable abscess.
IMPRESSION: 1. Abnormal edema and enhancement in the distal phalanx fourth toe
with associated subcutaneous edema and enhancement, compatible with
osteomyelitis of the distal phalanx. The proximal and middle
phalanges have normal signal.
2. Progressive demineralization in the head of the proximal phalanx
and adjacent middle phalanx of the third toe with local enhancement
and edema signal suspicious for septic proximal interphalangeal
joint with osteomyelitis involving the middle phalanx in the head of
the proximal phalanx.
3. Suspected avascular necrosis along the distal lateral navicular,
adjacent to its articulations with the middle and lateral
cuneiforms.
4. Suspected avascular necrosis of the head of the second
metatarsal.
5. Small osteochondral lesion along the plantar margin of the head
of the third metatarsal, this is nonspecific and could possibly be
degenerative.
6. Plantar fasciitis involving the proximal medial band of the
plantar fascia.
7. Distal tibialis posterior tendinopathy.
8. Cellulitis in the subcutaneous tissues of the fourth toe.
Nonspecific low-level infiltrative edema in the dorsum of the foot
and along the ball of the foot. No drainable soft tissue abscess
identified.

These results were called by telephone at the time of interpretation
on 01/20/2022 at [DATE] to provider Dr. Jewellery, who verbally
acknowledged these results.

## 2024-09-01 ENCOUNTER — Other Ambulatory Visit: Payer: Self-pay

## 2024-09-01 ENCOUNTER — Observation Stay (HOSPITAL_COMMUNITY)
Admission: EM | Admit: 2024-09-01 | Discharge: 2024-09-05 | Disposition: A | Discharge status: Home / Self Care | Attending: Internal Medicine | Admitting: Internal Medicine

## 2024-09-01 ENCOUNTER — Encounter (HOSPITAL_COMMUNITY): Payer: Self-pay

## 2024-09-01 ENCOUNTER — Emergency Department (HOSPITAL_COMMUNITY)

## 2024-09-01 DIAGNOSIS — R600 Localized edema: Secondary | ICD-10-CM | POA: Diagnosis present

## 2024-09-01 DIAGNOSIS — E119 Type 2 diabetes mellitus without complications: Secondary | ICD-10-CM

## 2024-09-01 DIAGNOSIS — Z23 Encounter for immunization: Secondary | ICD-10-CM | POA: Insufficient documentation

## 2024-09-01 DIAGNOSIS — E11621 Type 2 diabetes mellitus with foot ulcer: Principal | ICD-10-CM | POA: Insufficient documentation

## 2024-09-01 DIAGNOSIS — L97529 Non-pressure chronic ulcer of other part of left foot with unspecified severity: Secondary | ICD-10-CM | POA: Diagnosis not present

## 2024-09-01 DIAGNOSIS — S98132A Complete traumatic amputation of one left lesser toe, initial encounter: Secondary | ICD-10-CM | POA: Diagnosis not present

## 2024-09-01 DIAGNOSIS — E1165 Type 2 diabetes mellitus with hyperglycemia: Secondary | ICD-10-CM | POA: Insufficient documentation

## 2024-09-01 DIAGNOSIS — Z89422 Acquired absence of other left toe(s): Secondary | ICD-10-CM | POA: Insufficient documentation

## 2024-09-01 DIAGNOSIS — E876 Hypokalemia: Secondary | ICD-10-CM | POA: Diagnosis not present

## 2024-09-01 DIAGNOSIS — Z794 Long term (current) use of insulin: Secondary | ICD-10-CM | POA: Insufficient documentation

## 2024-09-01 DIAGNOSIS — E11628 Type 2 diabetes mellitus with other skin complications: Secondary | ICD-10-CM

## 2024-09-01 DIAGNOSIS — L089 Local infection of the skin and subcutaneous tissue, unspecified: Principal | ICD-10-CM | POA: Diagnosis present

## 2024-09-01 LAB — LACTIC ACID, PLASMA
Lactic Acid, Venous: 1.9 mmol/L (ref 0.5–1.9)
Lactic Acid, Venous: 2 mmol/L (ref 0.5–1.9)
Lactic Acid, Venous: 1.2 mmol/L (ref 0.5–1.9)

## 2024-09-01 LAB — CBC WITH DIFFERENTIAL/PLATELET
Abs Immature Granulocytes: 0.05 K/uL (ref 0.00–0.07)
Basophils Absolute: 0.1 K/uL (ref 0.0–0.1)
Basophils Relative: 1 %
Eosinophils Absolute: 0.2 K/uL (ref 0.0–0.5)
Eosinophils Relative: 2 %
HCT: 42 % (ref 39.0–52.0)
Hemoglobin: 14.7 g/dL (ref 13.0–17.0)
Immature Granulocytes: 1 %
Lymphocytes Relative: 22 %
Lymphs Abs: 2.2 K/uL (ref 0.7–4.0)
MCH: 29.6 pg (ref 26.0–34.0)
MCHC: 35 g/dL (ref 30.0–36.0)
MCV: 84.5 fL (ref 80.0–100.0)
Monocytes Absolute: 0.7 K/uL (ref 0.1–1.0)
Monocytes Relative: 7 %
Neutro Abs: 7 K/uL (ref 1.7–7.7)
Neutrophils Relative %: 67 %
Platelets: 282 K/uL (ref 150–400)
RBC: 4.97 MIL/uL (ref 4.22–5.81)
RDW: 12.4 % (ref 11.5–15.5)
WBC: 10.2 K/uL (ref 4.0–10.5)
nRBC: 0 % (ref 0.0–0.2)

## 2024-09-01 LAB — COMPREHENSIVE METABOLIC PANEL WITH GFR
ALT: 19 U/L (ref 0–44)
AST: 19 U/L (ref 15–41)
Albumin: 3.4 g/dL — ABNORMAL LOW (ref 3.5–5.0)
Alkaline Phosphatase: 71 U/L (ref 38–126)
Anion gap: 11 (ref 5–15)
BUN: 10 mg/dL (ref 6–20)
CO2: 23 mmol/L (ref 22–32)
Calcium: 8.8 mg/dL — ABNORMAL LOW (ref 8.9–10.3)
Chloride: 98 mmol/L (ref 98–111)
Creatinine, Ser: 0.77 mg/dL (ref 0.61–1.24)
GFR, Estimated: >60 mL/min (ref >60)
Glucose, Bld: 445 mg/dL — ABNORMAL HIGH (ref 70–99)
Potassium: 4 mmol/L (ref 3.5–5.1)
Sodium: 132 mmol/L — ABNORMAL LOW (ref 135–145)
Total Bilirubin: 1.2 mg/dL (ref 0.0–1.2)
Total Protein: 7.9 g/dL (ref 6.5–8.1)

## 2024-09-01 LAB — GLUCOSE, CAPILLARY: Glucose-Capillary: 291 mg/dL — ABNORMAL HIGH (ref 70–99)

## 2024-09-01 LAB — CBG MONITORING, ED: Glucose-Capillary: 306 mg/dL — ABNORMAL HIGH (ref 70–99)

## 2024-09-01 MED ORDER — INSULIN ASPART 100 UNIT/ML IJ SOLN
0.0000 [IU] | INTRAMUSCULAR | Status: DC
  Administered 2024-09-01 – 2024-09-02 (×2): 8 [IU] via SUBCUTANEOUS
  Administered 2024-09-02 (×2): 5 [IU] via SUBCUTANEOUS
  Administered 2024-09-02: 8 [IU] via SUBCUTANEOUS
  Administered 2024-09-02 – 2024-09-03 (×2): 3 [IU] via SUBCUTANEOUS
  Administered 2024-09-03: 5 [IU] via SUBCUTANEOUS
  Administered 2024-09-03 (×2): 11 [IU] via SUBCUTANEOUS
  Administered 2024-09-03: 5 [IU] via SUBCUTANEOUS
  Administered 2024-09-03: 2 [IU] via SUBCUTANEOUS
  Administered 2024-09-04 (×2): 8 [IU] via SUBCUTANEOUS
  Administered 2024-09-04: 5 [IU] via SUBCUTANEOUS
  Administered 2024-09-04: 3 [IU] via SUBCUTANEOUS
  Administered 2024-09-04: 8 [IU] via SUBCUTANEOUS
  Administered 2024-09-04: 5 [IU] via SUBCUTANEOUS
  Administered 2024-09-05 (×2): 3 [IU] via SUBCUTANEOUS
  Administered 2024-09-05: 5 [IU] via SUBCUTANEOUS
  Administered 2024-09-05: 8 [IU] via SUBCUTANEOUS

## 2024-09-01 MED ORDER — ONDANSETRON HCL 4 MG PO TABS: 4.0000 mg | ORAL_TABLET | Freq: Four times a day (QID) | ORAL | Status: DC | PRN

## 2024-09-01 MED ORDER — ACETAMINOPHEN 650 MG RE SUPP: 650.0000 mg | Freq: Four times a day (QID) | RECTAL | Status: DC | PRN

## 2024-09-01 MED ORDER — LINEZOLID 600 MG PO TABS
600.0000 mg | ORAL_TABLET | Freq: Two times a day (BID) | ORAL | Status: DC
  Administered 2024-09-01 – 2024-09-05 (×8): 600 mg via ORAL
  Filled 2024-09-01 (×10): qty 1

## 2024-09-01 MED ORDER — ACETAMINOPHEN 325 MG PO TABS: 650.0000 mg | ORAL_TABLET | Freq: Four times a day (QID) | ORAL | Status: DC | PRN

## 2024-09-01 MED ORDER — SODIUM CHLORIDE 0.9 % IV BOLUS
500.0000 mL | Freq: Once | INTRAVENOUS | Status: AC
Start: 2024-09-01 — End: 2024-09-02
  Administered 2024-09-01: 500 mL via INTRAVENOUS

## 2024-09-01 MED ORDER — INSULIN ASPART 100 UNIT/ML IJ SOLN
6.0000 [IU] | Freq: Once | INTRAMUSCULAR | Status: AC
  Administered 2024-09-01: 6 [IU] via INTRAVENOUS
  Filled 2024-09-01: qty 1

## 2024-09-01 MED ORDER — ONDANSETRON HCL 4 MG/2ML IJ SOLN: 4.0000 mg | Freq: Four times a day (QID) | INTRAMUSCULAR | Status: DC | PRN

## 2024-09-01 MED ORDER — SODIUM CHLORIDE 0.9 % IV BOLUS
1000.0000 mL | Freq: Once | INTRAVENOUS | Status: AC
Start: 2024-09-01 — End: 2024-09-01
  Administered 2024-09-01: 1000 mL via INTRAVENOUS

## 2024-09-01 MED ORDER — INFLUENZA VIRUS VACC SPLIT PF (FLUZONE) 0.5 ML IM SUSY
0.5000 mL | PREFILLED_SYRINGE | INTRAMUSCULAR | Status: AC
  Administered 2024-09-02: 0.5 mL via INTRAMUSCULAR
  Filled 2024-09-01: qty 0.5

## 2024-09-01 MED ORDER — SODIUM CHLORIDE 0.9 % IV SOLN
3.0000 g | Freq: Once | INTRAVENOUS | Status: AC
  Administered 2024-09-01: 3 g via INTRAVENOUS
  Filled 2024-09-01: qty 8

## 2024-09-01 MED ORDER — SODIUM CHLORIDE 0.9 % IV SOLN
2.0000 g | INTRAVENOUS | Status: DC
  Administered 2024-09-02 – 2024-09-05 (×4): 2 g via INTRAVENOUS
  Filled 2024-09-01 (×4): qty 20

## 2024-09-01 MED ORDER — ENOXAPARIN SODIUM 40 MG/0.4ML IJ SOSY
40.0000 mg | PREFILLED_SYRINGE | INTRAMUSCULAR | Status: DC
Start: 2024-09-02 — End: 2024-09-05
  Administered 2024-09-02 – 2024-09-05 (×4): 40 mg via SUBCUTANEOUS
  Filled 2024-09-01 (×4): qty 0.4

## 2024-09-01 MED ORDER — POLYETHYLENE GLYCOL 3350 17 G PO PACK: 17.0000 g | PACK | Freq: Every day | ORAL | Status: DC | PRN

## 2024-09-01 NOTE — Plan of Care (Signed)

## 2024-09-01 NOTE — Assessment & Plan Note (Addendum)
 Cellulitis to left foot. Scarring and eschar to fourth left toe, no open wounds appreciated.  No drainage.  WBC 10.2.  Lactic acid 1.9 > 2. rules out for sepsis.  S/p first big toe amputation of same left foot.  MRI without definitive findings of osteomyelitis. - Will continue with IV linezolid  started in ED, and add ceftriaxone . -1.5 L bolus given. -Follow-up blood cultures - Repeat lactic acid. - ABI

## 2024-09-01 NOTE — H&P (Addendum)
 History and Physical    GREY RAKESTRAW FMW:[id number] DOB: 1976-05-20 DOA: 09/01/2024  PCP: Jolee Elsie GORMAN, PA   Patient coming from: Home  I have personally briefly reviewed patient's old medical records in [facility]  Chief Complaint: Left foot pain and swelling  HPI: John Church is a 48 y.o. male with medical history significant for diabetes mellitus. Patient presented to the ED with complaints of swelling and redness to his fourth to of his left foot extending towards his ankle, he noticed this today.  He has no sensation in his lower extremities.  No fevers no chills.  No vomiting. He does not follow with a podiatrist, and has not seen his outpatient provider in about 2 years, and so is not taking any medications for his diabetes.  ED Course: Temperature 98.2.  Heart rate 75-93.  Respirate rate 16.  Blood pressure systolic 140s to 829d. WBC 10.2.  Lactic acid 1.9 > 2. X-ray of the left foot-soft tissue swelling of the fourth toe without definite bony destructive findings to indicate conventional radiologic evidence of active osteomyelitis. MRI-no definitive MR findings suspicious for osteomyelitis.  Diffuse subcutaneous soft tissue swelling/edema suggesting cellulitis.  Use of mild fasciitis. No discrete fluid collection to suggest subcutaneous abscess or pyomyositis. IV Unasyn  and linezolid  started. 1 L bolus given. Blood cultures obtained.  Review of Systems: As per HPI all other systems reviewed and negative.  Past Medical History:  Diagnosis Date   COVID-19 2020   Diabetes mellitus without complication (HCC)    type 2   Dyspnea    wtih exertion   History of kidney stones    Peripheral neuropathy     Past Surgical History:  Procedure Laterality Date   AMPUTATION Left 04/29/2021   Procedure: LEFT GREAT TOE AMPUTATION;  Surgeon: Harden Jerona GAILS, MD;  Location: [facility] OR;  Service: Orthopedics;  Laterality: Left;   MULTIPLE TOOTH EXTRACTIONS       reports that he has  never smoked. He has never been exposed to tobacco smoke. He has never used smokeless tobacco. He reports that he does not currently use alcohol. He reports that he does not use drugs.  Allergies  Allergen Reactions   Penicillins     Did it involve swelling of the face/tongue/throat, SOB, or low BP? Unknown Did it involve sudden or severe rash/hives, skin peeling, or any reaction on the inside of your mouth or nose? Unknown Did you need to seek medical attention at a hospital or doctor's office? Unknown When did it last happen? 48 year old       If all above answers are NO, may proceed with cephalosporin use.   CHILDHOOD ALLERGY  Pt states he has recently had penicillin in the hospital, and states he is no longer allergic.    Family History  Problem Relation Age of Onset   Diabetes Mother    Diabetes Father    Diabetes Brother    Prior to Admission medications   Medication Sig Start Date End Date Taking? Authorizing Provider  glipiZIDE  (GLUCOTROL  XL) 5 MG 24 hr tablet Take 1 tablet (5 mg total) by mouth daily with breakfast. 05/29/22   Therisa Benton PARAS, NP  glucose blood (ACCU-CHEK GUIDE) test strip Use as instructed to monitor glucose 4 times daily 05/04/21   Therisa Benton PARAS, NP  metFORMIN  (GLUCOPHAGE ) 500 MG tablet Take 2 tablets (1,000 mg total) by mouth 2 (two) times daily. 05/29/22   Therisa Benton PARAS, NP  ONGLYZA   5 MG TABS tablet Take 1 tablet (5 mg total) by mouth every evening. 05/29/22   Therisa Benton PARAS, NP  rosuvastatin  (CRESTOR ) 5 MG tablet Take 5 mg by mouth daily. 11/08/21   [provider]    Physical Exam: Vitals:   09/01/24 1433 09/01/24 1433 09/01/24 1913  BP:  (!) 171/101 (!) 146/88  Pulse:  93 75  Resp:  16 17  Temp:  98.2 F (36.8 C) 98.1 F (36.7 C)  TempSrc:  Oral Oral  SpO2:  99% 99%  Weight: 113.4 kg    Height: 6' 2 (1.88 m)      Constitutional: NAD, calm, comfortable Vitals:   09/01/24 1433 09/01/24 1433 09/01/24 1913  BP:  (!)  171/101 (!) 146/88  Pulse:  93 75  Resp:  16 17  Temp:  98.2 F (36.8 C) 98.1 F (36.7 C)  TempSrc:  Oral Oral  SpO2:  99% 99%  Weight: 113.4 kg    Height: 6' 2 (1.88 m)     Eyes: PERRL, lids and conjunctivae normal ENMT: Mucous membranes are moist.  Neck: normal, supple, no masses, no thyromegaly Respiratory: clear to auscultation bilaterally, no wheezing, no crackles. Normal respiratory effort. No accessory muscle use.  Cardiovascular: Regular rate and rhythm, no murmurs / rubs / gallops. No extremity edema.   Abdomen: no tenderness, no masses palpated. No hepatosplenomegaly. Bowel sounds positive.  Musculoskeletal: no clubbing / cyanosis.  Left foot big toe amputation. Skin: Several skin scratches from his cats.  Erythema, and some swelling differential warmth to left foot-extending from toes towards ankle. Reduced sensation to bilateral lower extremities. Neurologic: No facial asymmetry, moving extremity spontaneously, speech fluent. Psychiatric: Normal judgment and insight. Alert and oriented x 3. Normal mood.      Labs on Admission: I have personally reviewed following labs and imaging studies  CBC: Recent Labs  Lab 09/01/24 1520  WBC 10.2  NEUTROABS 7.0  HGB 14.7  HCT 42.0  MCV 84.5  PLT 282   Basic Metabolic Panel: Recent Labs  Lab 09/01/24 1520  NA 132*  K 4.0  CL 98  CO2 23  GLUCOSE 445*  BUN 10  CREATININE 0.77  CALCIUM  8.8*   GFR: Estimated Creatinine Clearance: 151.3 mL/min (by C-G formula based on SCr of 0.77 mg/dL). Liver Function Tests: Recent Labs  Lab 09/01/24 1520  AST 19  ALT 19  ALKPHOS 71  BILITOT 1.2  PROT 7.9  ALBUMIN 3.4*   CBG: Recent Labs  Lab 09/01/24 2045  GLUCAP 306*   Urine analysis:    Component Value Date/Time   COLORURINE YELLOW 12/01/2019 1717   APPEARANCEUR CLEAR 12/01/2019 1717   LABSPEC 1.032 (H) 12/01/2019 1717   PHURINE 6.0 12/01/2019 1717   GLUCOSEU >=500 (A) 12/01/2019 1717   HGBUR SMALL (A)  12/01/2019 1717   BILIRUBINUR NEGATIVE 12/01/2019 1717   KETONESUR 80 (A) 12/01/2019 1717   PROTEINUR 30 (A) 12/01/2019 1717   UROBILINOGEN 0.2 06/23/2009 0651   NITRITE NEGATIVE 12/01/2019 1717   LEUKOCYTESUR NEGATIVE 12/01/2019 1717    Radiological Exams on Admission: MR FOOT LEFT WO CONTRAST Result Date: 09/01/2024 CLINICAL DATA:  Left fourth toe infection. History of osteomyelitis. Prior great toe amputation. EXAM: MRI OF THE LEFT FOOT WITHOUT CONTRAST TECHNIQUE: Multiplanar, multisequence MR imaging of the left foot was performed. No intravenous contrast was administered. COMPARISON:  Radiographs 08/30/2024 and prior MRI 2023. FINDINGS: Poor distal fat saturation no definite MR findings suspicious for osteomyelitis. No findings for septic arthritis. Remote  amputation of the great toe. 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 Diffuse subcutaneous soft tissue swelling/edema suggesting cellulitis. There are also changes of myofasciitis no discrete fluid collection to suggest subcutaneous abscess or pyomyositis. Mild to moderate midfoot degenerative changes mainly involving the navicular articulation with the lateral cuneiform. Degenerative changes at the calcaneocuboid joint with a subchondral cyst or geode. CT IMPRESSION: 1. Poor distal fat saturation but no definite MR findings suspicious for osteomyelitis. 2. Remote amputation of the great toe. 3. 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 4. Diffuse subcutaneous soft tissue swelling/edema suggesting cellulitis. There are also changes of myofasciitis. No discrete fluid collection to suggest subcutaneous abscess or pyomyositis. Electronically Signed   By: MYRTIS Stammer M.D.   On: 09/01/2024 21:41   DG Toe 4th Left Result Date: 09/01/2024 CLINICAL DATA:  Left fourth toe infection. History of osteomyelitis. Prior great toe amputation. Swelling drainage. EXAM: LEFT  FOURTH TOE COMPARISON:  01/18/2022 FINDINGS: Great toe amputation. Ossicle or posttraumatic fragment medial to the third toe PIPs joint. Prior healed fracture of the proximal phalanx second toe. 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 Plantar and Achilles calcaneal spurs. Substantial dorsal spurring along the midfoot and Lisfranc joint. Dorsal subcutaneous edema along the forefoot. Soft tissue swelling of the fourth toe. No definite bony destructive findings to indicate conventional radiographic evidence of active osteomyelitis. IMPRESSION: 1. Soft tissue swelling of the fourth toe without definite bony destructive findings to indicate conventional radiographic evidence of active osteomyelitis. 2. Great toe amputation. 3. 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 4. Dorsal subcutaneous edema along the forefoot. 5. Plantar and Achilles calcaneal spurs. 6. Substantial dorsal spurring along the midfoot and Lisfranc joint. Electronically Signed   By: Ryan Salvage M.D.   On: 09/01/2024 15:35   EKG: None   Assessment/Plan Principal Problem:   Diabetic foot infection (HCC) Active Problems:   DM (diabetes mellitus) (HCC)   Amputated toe of left foot (HCC)   Assessment and Plan: * Diabetic foot infection (HCC) Cellulitis to left foot. Scarring and eschar to fourth left toe, no open wounds appreciated.  No drainage.  WBC 10.2.  Lactic acid 1.9 > 2. rules out for sepsis.  S/p first big toe amputation of same left foot.  MRI without definitive findings of osteomyelitis. - Will continue with IV linezolid  started in ED, and add ceftriaxone . -1.5 L bolus given. -Follow-up blood cultures - Repeat lactic acid. - ABI  DM (diabetes mellitus) (HCC) Diabetes mellitus with hyperglycemia.  Blood glucose 445 > 306. Patient has not seen his outpatient provider in about 2  years, and so has not been on any medications for several months.  He is supposed to be on glipizide , metformin  sitagliptin (Onglyza ). - HgbA1c - SSI- M -Diabetes coordinator consult   DVT prophylaxis: Lovenox  Code Status: FULL Family Communication: None at bedside Disposition Plan: Home, > 2 days Consults called:  None Admission status: Inpt Med surg I certify that at the point of admission it is my clinical judgment that the patient will require inpatient hospital care spanning beyond 2 midnights from the point of admission due to high intensity of service, high risk for further deterioration and high frequency of surveillance required.   Author: Tully FORBES Carwin, MD 09/01/2024 10:15 PM  For on call review [email or web address].

## 2024-09-01 NOTE — ED Triage Notes (Signed)
 Pt arrived via POV c/o left 4th toe infection. Pt reports Hx of osteomyelitis and left great toe amputation. Pt reports noticing swelling and his toe began oozing today. Pt ambulatory in Triage.

## 2024-09-01 NOTE — Assessment & Plan Note (Signed)
 Diabetes mellitus with hyperglycemia.  Blood glucose 445 > 306. Patient has not seen his outpatient provider in about 2 years, and so has not been on any medications for several months.  He is supposed to be on glipizide , metformin  sitagliptin (Onglyza ). - HgbA1c - SSI- M -Diabetes coordinator consult

## 2024-09-01 NOTE — ED Provider Notes (Signed)
 [place] EMERGENCY DEPARTMENT AT [facility] Provider Note   CSN: [id number] Arrival date & time: 09/01/24  1428     Patient presents with: Toe Pain   John Church is a 48 y.o. 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  History of infection in the same foot.  He reports that he has some intermittent swelling that he has noticed for couple of weeks, but noticed oozing, redness today.  Denies any pain at rest, reports that he has no sensation at rest.  Denies any systemic fever, chills.  He reports he does not know what his blood sugar normally is because he does not check.  He does not follow regularly with wound care.  He denies any known injury to the foot.    Toe Pain       Prior to Admission medications   Medication Sig Start Date End Date Taking? Authorizing Provider  glipiZIDE  (GLUCOTROL  XL) 5 MG 24 hr tablet Take 1 tablet (5 mg total) by mouth daily with breakfast. 05/29/22   Therisa Benton PARAS, NP  glucose blood (ACCU-CHEK GUIDE) test strip Use as instructed to monitor glucose 4 times daily 05/04/21   Therisa Benton PARAS, NP  metFORMIN  (GLUCOPHAGE ) 500 MG tablet Take 2 tablets (1,000 mg total) by mouth 2 (two) times daily. 05/29/22   Therisa Benton PARAS, NP  ONGLYZA  5 MG TABS tablet Take 1 tablet (5 mg total) by mouth every evening. 05/29/22   Therisa Benton PARAS, NP  rosuvastatin  (CRESTOR ) 5 MG tablet Take 5 mg by mouth daily. 11/08/21   [provider]    Allergies: Penicillins    Review of Systems  All other systems reviewed and are negative.   Updated Vital Signs BP (!) 146/88 (BP Location: Left Arm)   Pulse 75   Temp 98.1 F (36.7 C) (Oral)   Resp 17   Ht 6' 2 (1.88 m)   Wt 113.4 kg   SpO2 99%   BMI 32.10 kg/m   Physical Exam Vitals and nursing note reviewed.   Constitutional:      General: He is not in acute distress.    Appearance: Normal appearance.  HENT:     Head: Normocephalic and atraumatic.  Eyes:     General:        Right eye: No discharge.        Left eye: No discharge.  Cardiovascular:     Rate and Rhythm: Normal rate and regular rhythm.     Pulses: Normal pulses.     Comments: Strong 2+ DP, PT pulses in left lower extremity Pulmonary:     Effort: Pulmonary effort is normal. No respiratory distress.  Musculoskeletal:        General: No deformity.     Comments: Soft tissue swelling, redness, fluctuance noted to the dorsum of the left foot, most locally at the fourth toe.  There is some questionable purulent collection with no active drainage at time of my evaluation.  Some tenderness to palpation.  No open wound noted.  Skin:    General: Skin is warm and dry.     Capillary Refill: Capillary refill takes less than 2 seconds.  Neurological:     Mental Status: He is alert and oriented to person, place, and time.  Psychiatric:        Mood and Affect: Mood normal.  Behavior: Behavior normal.     (all labs ordered are listed, but only abnormal results are displayed) Labs Reviewed  COMPREHENSIVE METABOLIC PANEL WITH GFR - Abnormal; Notable for the following components:      Result Value   Sodium 132 (*)    Glucose, Bld 445 (*)    Calcium  8.8 (*)    Albumin 3.4 (*)    All other components within normal limits  LACTIC ACID, PLASMA - Abnormal; Notable for the following components:   Lactic Acid, Venous 2.0 (*)    All other components within normal limits  CBG MONITORING, ED - Abnormal; Notable for the following components:   Glucose-Capillary 306 (*)    All other components within normal limits  CULTURE, BLOOD (SINGLE)  CULTURE, BLOOD (SINGLE)  CBC WITH DIFFERENTIAL/PLATELET  LACTIC ACID, PLASMA    EKG: None  Radiology: DG Toe 4th Left Result Date: 09/01/2024 CLINICAL DATA:  Left fourth toe infection. History of  osteomyelitis. Prior great toe amputation. Swelling drainage. EXAM: LEFT FOURTH TOE COMPARISON:  01/18/2022 FINDINGS: Great toe amputation. Ossicle or posttraumatic fragment medial to the third toe PIPs joint. Prior healed fracture of the proximal phalanx second toe. 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 Plantar and Achilles calcaneal spurs. Substantial dorsal spurring along the midfoot and Lisfranc joint. Dorsal subcutaneous edema along the forefoot. Soft tissue swelling of the fourth toe. No definite bony destructive findings to indicate conventional radiographic evidence of active osteomyelitis. IMPRESSION: 1. Soft tissue swelling of the fourth toe without definite bony destructive findings to indicate conventional radiographic evidence of active osteomyelitis. 2. Great toe amputation. 3. 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 4. Dorsal subcutaneous edema along the forefoot. 5. Plantar and Achilles calcaneal spurs. 6. Substantial dorsal spurring along the midfoot and Lisfranc joint. Electronically Signed   By: Ryan Salvage M.D.   On: 09/01/2024 15:35     Procedures   Medications Ordered in the ED  Ampicillin -Sulbactam (UNASYN ) 3 g in sodium chloride  0.9 % 100 mL IVPB (3 g Intravenous New Bag/Given 09/01/24 2045)  linezolid  (ZYVOX ) tablet 600 mg (has no administration in time range)  sodium chloride  0.9 % bolus 1,000 mL (0 mLs Intravenous Stopped 09/01/24 1943)  insulin  aspart (novoLOG ) injection 6 Units (6 Units Intravenous Given 09/01/24 1714)    Clinical Course as of 09/01/24 2103  Mon Sep 01, 2024  1542 seen [LS]    Clinical Course User Index [LS] Rogelia Jerilynn RAMAN, MD                                 Medical Decision Making Amount and/or Complexity of Data Reviewed Labs: ordered. Radiology: ordered.   This patient is a 48 y.o. male   who presents to the ED for concern of toe pain, infection.   Differential diagnoses prior to evaluation: The emergent differential diagnosis includes, but is not limited to,  osteomyelitis vs simple cellulitis . This is not an exhaustive differential.   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 Pertinent results include: Reviewed lab work, imaging from previous emergency department visits, including previous hospitalizations for osteomyelitis.  Physical Exam: Physical exam performed. The pertinent findings include: Soft tissue swelling, redness, fluctuance noted to the dorsum of the left foot,  most locally at the fourth toe.  There is some questionable purulent collection with no active drainage at time of my evaluation.  Some tenderness to palpation.  No open wound noted.   Strong 2+ DP, PT pulses in left lower extremity  Lab Tests/Imaging studies: I personally interpreted labs/imaging and the pertinent results include: CBC unremarkable.  Lactic acid unremarkable.  CMP notable for pseudohyponatremia, sodium 132 in context of glucose 445.  No anion gap, suspect chronically elevated, poorly controlled at baseline.  Will administer small insulin  and fluid bolus while patient is receiving further evaluation, MR.  I independently turbid plain film radiograph of the left foot which does not show any clear osteonecrosis or osteomyelitis of the affected fourth digit, given his risk factors I do think that patient would benefit from MRI of the left foot, MR shows Marrow edema, soft tissue swelling, 4th toe. Some increased signal in distal phalanx. Possible mild / early osteo Dr. Scott. I agree with the radiologist interpretation.  Medications: I ordered medication including fluid bolus insulin  for hyperglycemia, Unasyn , Zyvox  for diabetic  foot wound.  I have reviewed the patients home medicines and have made adjustments as needed.  Consults: Spoke with the radiologist as above who with increased signal is suspicious for early mild osteomyelitis, given his high risk of future amputations given poorly controlled diabetic foot wounds  Spoke with the hospitalist, Dr. Pearlean who agrees to admission for diabetic foot wound, IV antibiotics   Disposition: After consideration of the diagnostic results and the patients response to treatment, I feel that patient would benefit from hospital admission as discussed above.    Final diagnoses:  None    ED Discharge Orders     None          John Church 09/01/24 2103    Rogelia Jerilynn RAMAN, MD 09/02/24 [phone]

## 2024-09-01 NOTE — ED Provider Triage Note (Signed)
 Emergency Medicine Provider Triage Evaluation Note  John Church , a 48 y.o. male  was evaluated in triage.  Pt complains of left fourth toe pain.  Patient reports that he has a history of diabetes and diabetes related neuropathy, and previously had osteomyelitis requiring amputation of his left great toe.  Reports that he noted swelling, redness, and discharge from his left fourth toe today when he took off his socks, denies any associated symptoms, denies noting any abnormalities prior to today.  Review of Systems  Positive: Erythema of and discharge from left fourth great toe Negative: Fever, chills, nausea, vomiting, diarrhea  Physical Exam  BP (!) 171/101 (BP Location: Right Arm)   Pulse 93   Temp 98.2 F (36.8 C) (Oral)   Resp 16   Ht 6' 2 (1.88 m)   Wt 113.4 kg   SpO2 99%   BMI 32.10 kg/m  Gen:   Awake, no distress   Resp:  Normal effort  MSK:   Moves extremities without difficulty  Medical Decision Making  Medically screening exam initiated at 2:54 PM.  Appropriate orders placed.  John Church was informed that the remainder of the evaluation will be completed by another provider, this initial triage assessment does not replace that evaluation, and the importance of remaining in the ED until their evaluation is complete.    Rogelia Jerilynn GORMAN, MD 09/01/24 [phone]

## 2024-09-02 ENCOUNTER — Other Ambulatory Visit (HOSPITAL_COMMUNITY): Payer: Self-pay

## 2024-09-02 ENCOUNTER — Inpatient Hospital Stay (HOSPITAL_COMMUNITY)

## 2024-09-02 ENCOUNTER — Telehealth (HOSPITAL_COMMUNITY): Payer: Self-pay | Admitting: Pharmacy Technician

## 2024-09-02 ENCOUNTER — Telehealth (HOSPITAL_COMMUNITY): Payer: Self-pay

## 2024-09-02 DIAGNOSIS — E11628 Type 2 diabetes mellitus with other skin complications: Secondary | ICD-10-CM | POA: Diagnosis not present

## 2024-09-02 DIAGNOSIS — E876 Hypokalemia: Secondary | ICD-10-CM

## 2024-09-02 DIAGNOSIS — E1165 Type 2 diabetes mellitus with hyperglycemia: Secondary | ICD-10-CM | POA: Diagnosis not present

## 2024-09-02 DIAGNOSIS — S98132A Complete traumatic amputation of one left lesser toe, initial encounter: Secondary | ICD-10-CM | POA: Diagnosis not present

## 2024-09-02 LAB — CBC
HCT: 39.6 % (ref 39.0–52.0)
Hemoglobin: 13.3 g/dL (ref 13.0–17.0)
MCH: 28.7 pg (ref 26.0–34.0)
MCHC: 33.6 g/dL (ref 30.0–36.0)
MCV: 85.5 fL (ref 80.0–100.0)
Platelets: 264 K/uL (ref 150–400)
RBC: 4.63 MIL/uL (ref 4.22–5.81)
RDW: 12.3 % (ref 11.5–15.5)
WBC: 8.1 K/uL (ref 4.0–10.5)
nRBC: 0 % (ref 0.0–0.2)

## 2024-09-02 LAB — GLUCOSE, CAPILLARY
Glucose-Capillary: 253 mg/dL — ABNORMAL HIGH (ref 70–99)
Glucose-Capillary: 170 mg/dL — ABNORMAL HIGH (ref 70–99)
Glucose-Capillary: 217 mg/dL — ABNORMAL HIGH (ref 70–99)
Glucose-Capillary: 226 mg/dL — ABNORMAL HIGH (ref 70–99)
Glucose-Capillary: 207 mg/dL — ABNORMAL HIGH (ref 70–99)
Glucose-Capillary: 276 mg/dL — ABNORMAL HIGH (ref 70–99)

## 2024-09-02 LAB — HIV ANTIBODY (ROUTINE TESTING W REFLEX): HIV Screen 4th Generation wRfx: NONREACTIVE

## 2024-09-02 LAB — BASIC METABOLIC PANEL WITH GFR
Anion gap: 7 (ref 5–15)
BUN: 7 mg/dL (ref 6–20)
CO2: 24 mmol/L (ref 22–32)
Calcium: 8.1 mg/dL — ABNORMAL LOW (ref 8.9–10.3)
Chloride: 104 mmol/L (ref 98–111)
Creatinine, Ser: 0.61 mg/dL (ref 0.61–1.24)
GFR, Estimated: >60 mL/min (ref >60)
Glucose, Bld: 219 mg/dL — ABNORMAL HIGH (ref 70–99)
Potassium: 3.4 mmol/L — ABNORMAL LOW (ref 3.5–5.1)
Sodium: 135 mmol/L (ref 135–145)

## 2024-09-02 LAB — HEMOGLOBIN A1C
Hgb A1c MFr Bld: 12.3 % — ABNORMAL HIGH (ref 4.8–5.6)
Mean Plasma Glucose: 306.31 mg/dL

## 2024-09-02 MED ORDER — INSULIN ASPART 100 UNIT/ML IJ SOLN
3.0000 [IU] | Freq: Three times a day (TID) | INTRAMUSCULAR | Status: DC
  Administered 2024-09-02 – 2024-09-05 (×9): 3 [IU] via SUBCUTANEOUS

## 2024-09-02 MED ORDER — LIVING WELL WITH DIABETES BOOK: Freq: Once | Status: AC

## 2024-09-02 MED ORDER — POTASSIUM CHLORIDE 20 MEQ PO PACK
40.0000 meq | PACK | Freq: Every day | ORAL | Status: AC
  Administered 2024-09-02 – 2024-09-03 (×2): 40 meq via ORAL
  Filled 2024-09-02 (×2): qty 2

## 2024-09-02 MED ORDER — INSULIN GLARGINE 100 UNIT/ML ~~LOC~~ SOLN
15.0000 [IU] | Freq: Every day | SUBCUTANEOUS | Status: DC
  Administered 2024-09-02 – 2024-09-03 (×2): 15 [IU] via SUBCUTANEOUS
  Filled 2024-09-02 (×3): qty 0.15

## 2024-09-02 NOTE — Plan of Care (Signed)
   Problem: Activity: Goal: Risk for activity intolerance will decrease Outcome: Progressing   Problem: Coping: Goal: Level of anxiety will decrease Outcome: Progressing

## 2024-09-02 NOTE — Telephone Encounter (Signed)
 Pharmacy Patient Advocate Encounter   Received notification from Inpatient Request that prior authorization for FreeStyle Libre 3 Plus Sensor  is required/requested.   Insurance verification completed.   The patient is insured through [facility] .   Per test claim: PA required; PA submitted to above mentioned insurance via Latent Key/confirmation #/EOC [id number] Status is pending

## 2024-09-02 NOTE — Progress Notes (Signed)
   09/02/24 0954  TOC Brief Assessment  Insurance and Status Reviewed  Patient has primary care physician Yes  Home environment has been reviewed Single family home  Prior level of function: Independent  Prior/Current Home Services No current home services  Social Drivers of Health Review SDOH reviewed no interventions necessary  Readmission risk has been reviewed Yes  Transition of care needs no transition of care needs at this time    Inpatient Care Management Department (ICM) has reviewed patient and no ICM needs have been identified at this time. We will continue to monitor patient advancement through interdisciplinary progression rounds. If new patient transition needs arise, please place a ICM consult.

## 2024-09-02 NOTE — Telephone Encounter (Signed)
 Pharmacy Patient Advocate Encounter  Received notification from [employer] that Prior Authorization for FreeStyle Libre 3 Plus Sensor  has been APPROVED from 09/02/2024 to 09/02/2025. Ran test claim, Copay is $75.14. This test claim was processed through [facility]- copay amounts may vary at other pharmacies due to pharmacy/plan contracts, or as the patient moves through the different stages of their insurance plan.   PA #/Case ID/Reference #: [id number]

## 2024-09-02 NOTE — Plan of Care (Signed)

## 2024-09-02 NOTE — Inpatient Diabetes Management (Signed)
 Inpatient Diabetes Program Recommendations  AACE/ADA: New Consensus Statement on Inpatient Glycemic Control (2015)  Target Ranges:  Prepandial:   less than 140 mg/dL      Peak postprandial:   less than 180 mg/dL (1-2 hours)      Critically ill patients:  140 - 180 mg/dL   Lab Results  Component Value Date   GLUCAP 226 (H) 09/02/2024   HGBA1C 12.3 (H) 09/01/2024    Review of Glycemic Control  Latest Reference Range & Units 09/01/24 20:45 09/01/24 22:19 09/02/24 03:04 09/02/24 06:35 09/02/24 07:24 09/02/24 11:11  Glucose-Capillary 70 - 99 mg/dL 693 (H) 708 (H) 746 (H) 217 (H) 170 (H) 226 (H)  (H): Data is abnormally high  Diabetes history: DM2 Outpatient Diabetes medications: None Current orders for Inpatient glycemic control: Novolog  0-15 units Q4H  Inpatient Diabetes Program Recommendations:    Please consider:  Lantus  15 units every day Novolog  0-9 units TID and 0-5 units at bedtime Novolog  3 units TID with meals  Ordered LWWDM booklet. Spoke with patient over the phone.   Reviewed patient's current A1c of 12.3% (average BG of 355 mg/dL). Explained what a A1c is and what it measures. Also reviewed goal A1c with patient, importance of good glucose control @ home, and blood sugar goals.    He stopped taking his DM medications 2 years ago because he did not want to take them anymore.  He used to be current with Dr. Jolee in [place].  Asked TOC pharmacy for a benefit check on insulins and CGMs.  Patient is will to start insulin  at home.    He drinks a lot of soft drinks and energy drinks.  Educated on The Plate Method, CHO's, portion control, avoiding caloric beverages, CBGs at home fasting and mid afternoon, F/U with PCP every 3 months, bring meter to PCP office, long and short term complications of uncontrolled BG, and importance of exercise.  Asked him to make a PCP appointment with Dr. Jolee for in 2 weeks.  Patient will need a new glucometer a DC.  He is interested in CGM.    Reviewed hypoglycemia, < 70 mg/dL, signs, symptoms and treatments.   Will follow up with him tomorrow.  Thank you, Wyvonna Pinal, MSN, CDCES Diabetes Coordinator Inpatient Diabetes Program [phone] (team pager from 8a-5p)

## 2024-09-02 NOTE — Progress Notes (Signed)
 Progress Note   Patient: John Church FMW:[id number] DOB: 1976/11/29 DOA: 09/01/2024     1 DOS: the patient was seen and examined on 09/02/2024   Brief hospital course: John Church is a 48 y.o. male with medical history significant for diabetes mellitus, non complaint presented to the ED with complaints of swelling and redness to his fourth toe of his left foot extending towards his ankle. States he has no sensation in his lower extremities. He does not follow with a podiatrist, and has not seen his outpatient provider in about 2 years, and so is not taking any medications for his diabetes. In the ED patient is hemodynamically stable, x-ray showed left foot soft tissue swelling, MRI foot showed no spacious findings for osteomyelitis.  Patient got IV Unasyn , linezolid  therapy admitted to TRH service for further management evaluation.  Assessment and Plan: * Diabetic left foot infection (HCC) Cellulitis to left foot. Scarring and eschar to fourth left toe, no open wound or drainage appreciated.   S/p first big toe amputation of same left foot. MRI without definitive findings of osteomyelitis. Follow-up blood cultures. ABI pending.  Continue with IV linezolid , ceftriaxone  therapy. If no improvement will consult podiatry team.   Uncontrolled type 2 Diabetes mellitus- Noncompliant with medications. Blood sugars upon presentation around 445. Diabetes educator on board.   Lantus  15 units, NovoLog  3 units 3 times daily started. Continue Accu-Cheks, sliding scale insulin . Discussed with him regarding tight blood sugar control for wound healing.  Hypokalemia: Oral potassium supplements ordered.     Out of bed to chair. Incentive spirometry. Nursing supportive care. Fall, aspiration precautions. Diet:  Diet Orders (From admission, onward)     Start     Ordered   09/01/24 2213  Diet heart healthy/carb modified Room service appropriate? Yes; Fluid consistency: Thin  Diet effective now        Question Answer Comment  Diet-HS Snack? Nothing   Room service appropriate? Yes   Fluid consistency: Thin      09/01/24 2212           DVT prophylaxis: enoxaparin  (LOVENOX ) injection 40 mg Start: 09/02/24 1000 SCDs Start: 09/01/24 2213  Level of care: Med-Surg   Code Status: Full Code  Subjective: Patient is seen and examined today morning.  He is lying comfortably.  States he does not feel pain bilateral lower extremity.  Does have bilateral lower extremity swelling.  Physical Exam: Vitals:   09/01/24 2219 09/02/24 0204 09/02/24 0611 09/02/24 1108  BP: (!) 172/69 (!) 142/83 (!) 152/85 (!) 145/86  Pulse:  73 77 81  Resp:  18 18 20   Temp:  98.3 F (36.8 C) 98.6 F (37 C) 98.7 F (37.1 C)  TempSrc:  Oral Oral Oral  SpO2:  95% 100% 97%  Weight:      Height:        General -  Middle aged Caucasian male, no apparent distress HEENT - PERRLA, EOMI, atraumatic head, non tender sinuses. Lung - Clear, basal rales, no rhonchi, wheezes. Heart - S1, S2 heard, no murmurs, rubs, 1+ pitting pedal edema. Abdomen - Soft, non tender, bowel sounds good Neuro - Alert, awake and oriented x 3, non focal exam. Skin - Warm and dry.  Left fourth toe swelling, redness, extending to foot.  Status post left toe amputation.  Data Reviewed:      Latest Ref Rng & Units 09/02/2024    5:10 AM 09/01/2024    3:20 PM 01/20/2022    2:07  AM  CBC  WBC 4.0 - 10.5 K/uL 8.1  10.2  8.3   Hemoglobin 13.0 - 17.0 g/dL 86.6  85.2  85.4   Hematocrit 39.0 - 52.0 % 39.6  42.0  41.8   Platelets 150 - 400 K/uL 264  282  207       Latest Ref Rng & Units 09/02/2024    5:10 AM 09/01/2024    3:20 PM 01/20/2022    2:07 AM  BMP  Glucose 70 - 99 mg/dL 780  554  825   BUN 6 - 20 mg/dL 7  10  10    Creatinine 0.61 - 1.24 mg/dL 9.38  9.22  9.20   Sodium 135 - 145 mmol/L 135  132  136   Potassium 3.5 - 5.1 mmol/L 3.4  4.0  3.8   Chloride 98 - 111 mmol/L 104  98  104   CO2 22 - 32 mmol/L 24  23  24    Calcium  8.9 - 10.3  mg/dL 8.1  8.8  8.5    MR FOOT LEFT WO CONTRAST Result Date: 09/01/2024 CLINICAL DATA:  Left fourth toe infection. History of osteomyelitis. Prior great toe amputation. EXAM: MRI OF THE LEFT FOOT WITHOUT CONTRAST TECHNIQUE: Multiplanar, multisequence MR imaging of the left foot was performed. No intravenous contrast was administered. COMPARISON:  Radiographs 08/30/2024 and prior MRI 2023. FINDINGS: Poor distal fat saturation no definite MR findings suspicious for osteomyelitis. No findings for septic arthritis. Remote amputation of the great toe. 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 Diffuse subcutaneous soft tissue swelling/edema suggesting cellulitis. There are also changes of myofasciitis no discrete fluid collection to suggest subcutaneous abscess or pyomyositis. Mild to moderate midfoot degenerative changes mainly involving the navicular articulation with the lateral cuneiform. Degenerative changes at the calcaneocuboid joint with a subchondral cyst or geode. CT IMPRESSION: 1. Poor distal fat saturation but no definite MR findings suspicious for osteomyelitis. 2. Remote amputation of the great toe. 3. 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 4. Diffuse subcutaneous soft tissue swelling/edema suggesting cellulitis. There are also changes of myofasciitis. No discrete fluid collection to suggest subcutaneous abscess or pyomyositis. Electronically Signed   By: MYRTIS Stammer M.D.   On: 09/01/2024 21:41   DG Toe 4th Left Result Date: 09/01/2024 CLINICAL DATA:  Left fourth toe infection. History of osteomyelitis. Prior great toe amputation. Swelling drainage. EXAM: LEFT FOURTH TOE COMPARISON:  01/18/2022 FINDINGS: Great toe amputation. Ossicle or posttraumatic fragment medial to the third toe PIPs joint. Prior healed fracture of the proximal phalanx second toe. Flattening of the head of the second metatarsal compatible with  avascular necrosis/Freiberg's disease, unchanged. Plantar and Achilles calcaneal spurs. Substantial dorsal spurring along the midfoot and Lisfranc joint. Dorsal subcutaneous edema along the forefoot. Soft tissue swelling of the fourth toe. No definite bony destructive findings to indicate conventional radiographic evidence of active osteomyelitis. IMPRESSION: 1. Soft tissue swelling of the fourth toe without definite bony destructive findings to indicate conventional radiographic evidence of active osteomyelitis. 2. Great toe amputation. 3. 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 4. Dorsal subcutaneous edema along the forefoot. 5. Plantar and Achilles calcaneal spurs. 6. Substantial dorsal spurring along the midfoot and Lisfranc joint. Electronically Signed   By: Ryan Salvage M.D.   On: 09/01/2024 15:35    Family Communication: Discussed with patient, understand and agree. All questions answered.  Disposition: Status is: Inpatient Remains inpatient  appropriate because: IV antibiotics, ABI, blood sugar control  Planned Discharge Destination: Home     Time spent: 46 minutes  Author: Concepcion Riser, MD 09/02/2024 1:13 PM Secure chat 7am to 7pm For on call review [email or web address].

## 2024-09-02 NOTE — Telephone Encounter (Signed)
 Patient Product/process development scientist completed.    The patient is insured through [facility] MEDICAID.     Ran test claim for [employer] and Requires Prior Authorization  Ran test claim for [employer] and Requires Prior Authorization  This test claim was processed through [employer]- copay amounts may vary at other pharmacies due to [employer], or as the patient moves through the different stages of their insurance plan.     Reyes Sharps, CPHT Pharmacy Technician III Certified Patient Advocate [facility] Pharm[facility] Patient Advocate Team Direct Number: [phone]  Fax: [phone]

## 2024-09-02 NOTE — Telephone Encounter (Signed)
 Pharmacy Patient Advocate Encounter  Insurance verification completed.    The patient is insured through [facility].     Ran test claim for Lantus  and the current 30 day co-pay is $14.08.   This test claim was processed through [facility]- copay amounts may vary at other pharmacies due to pharmacy/plan contracts, or as the patient moves through the different stages of their insurance plan.

## 2024-09-03 DIAGNOSIS — L089 Local infection of the skin and subcutaneous tissue, unspecified: Secondary | ICD-10-CM | POA: Diagnosis not present

## 2024-09-03 DIAGNOSIS — E1165 Type 2 diabetes mellitus with hyperglycemia: Secondary | ICD-10-CM | POA: Diagnosis not present

## 2024-09-03 DIAGNOSIS — E11628 Type 2 diabetes mellitus with other skin complications: Secondary | ICD-10-CM | POA: Diagnosis not present

## 2024-09-03 DIAGNOSIS — S98132A Complete traumatic amputation of one left lesser toe, initial encounter: Secondary | ICD-10-CM | POA: Diagnosis not present

## 2024-09-03 LAB — GLUCOSE, CAPILLARY
Glucose-Capillary: 183 mg/dL — ABNORMAL HIGH (ref 70–99)
Glucose-Capillary: 198 mg/dL — ABNORMAL HIGH (ref 70–99)
Glucose-Capillary: 221 mg/dL — ABNORMAL HIGH (ref 70–99)
Glucose-Capillary: 241 mg/dL — ABNORMAL HIGH (ref 70–99)
Glucose-Capillary: 305 mg/dL — ABNORMAL HIGH (ref 70–99)
Glucose-Capillary: 319 mg/dL — ABNORMAL HIGH (ref 70–99)

## 2024-09-03 MED ORDER — INSULIN GLARGINE 100 UNIT/ML ~~LOC~~ SOLN
20.0000 [IU] | Freq: Every day | SUBCUTANEOUS | Status: DC
  Administered 2024-09-04 – 2024-09-05 (×2): 20 [IU] via SUBCUTANEOUS
  Filled 2024-09-03 (×3): qty 0.2

## 2024-09-03 NOTE — Progress Notes (Signed)
 Progress Note   Patient: John Church FMW:[id number] DOB: Jul 21, 1976 DOA: 09/01/2024     2 DOS: the patient was seen and examined on 09/03/2024   Brief hospital course: [name] is a 48 y.o. male with medical history significant for diabetes mellitus, non complaint presented to the ED with complaints of swelling and redness to his fourth toe of his left foot extending towards his ankle. States he has no sensation in his lower extremities. He does not follow with a podiatrist, and has not seen his outpatient provider in about 2 years, and so is not taking any medications for his diabetes. In the ED patient is hemodynamically stable, x-ray showed left foot soft tissue swelling, MRI foot showed no spacious findings for osteomyelitis.  Patient got IV Unasyn , linezolid  therapy admitted to TRH service for further management evaluation.  Assessment and Plan: * Diabetic left foot infection (HCC) -Cellulitis to left foot. Scarring and eschar to fourth left toe, no open wound or drainage appreciated.   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 MRI without definitive findings of osteomyelitis. Follow-up blood cultures. ABI pending.  -Continue with IV linezolid  and IV ceftriaxone  therapy.  -outpatient follow up with podiatrist recommended.  Uncontrolled type 2 Diabetes mellitus- -Noncompliant with medications. -Blood sugars upon presentation around 445; acute infection most likely involved in elevated blood sugar levels. -Continue to follow CBG fluctuation and further adjust hypoglycemic regimen as needed.  Will continue adjusted dose of Lantus , continue sliding scale insulin  and NovoLog  meal coverage.  Hypokalemia: -Continue electrolytes repletion as needed - Patient advised to maintain adequate hydration and nutrition.  Diet:  Diet Orders (From admission, onward)     Start     Ordered   09/01/24 2213  Diet heart healthy/carb modified Room service appropriate? Yes; Fluid consistency:  Thin  Diet effective now       Question Answer Comment  Diet-HS Snack? Nothing   Room service appropriate? Yes   Fluid consistency: Thin      09/01/24 2212           DVT prophylaxis: enoxaparin  (LOVENOX ) injection 40 mg Start: 09/02/24 1000 SCDs Start: 09/01/24 2213  Level of care: Med-Surg   Code Status: Full Code  Subjective: Afebrile, no chest pain, no nausea, no vomiting.  No overnight events.  Physical Exam: Vitals:   09/03/24 0600 09/03/24 0832 09/03/24 1222 09/03/24 1948  BP: (!) 147/91 (!) 161/88 (!) 146/94 (!) 143/84  Pulse: 91 87 80   Resp: 20 16 18 16   Temp: 98.2 F (36.8 C) 98.1 F (36.7 C) 98.4 F (36.9 C) 98.8 F (37.1 C)  TempSrc: Oral Oral Oral Oral  SpO2: 92% 95% 97% 97%  Weight:      Height:       General exam: Alert, awake, oriented x 3; afebrile and in no major distress.  Reporting complete numbness and lack of sensation in his left foot.  Expressed still noticing some serosanguineous drainage out of his left fourth toe. Respiratory system: Clear to auscultation. Respiratory effort normal. Cardiovascular system:RRR. No rubs or gallops; no JVD. Gastrointestinal system: Abdomen is nondistended, soft and nontender. No organomegaly or masses felt. Normal bowel sounds heard. Central nervous system:  No focal neurological deficits. Extremities: No cyanosis or clubbing; status post first ray amputation on his left foot. Skin: No petechiae, left fourth toe swollen, red and with open wound/skin disruption appreciated on exam, Mild serosanguineous drainage appreciated. No odor. Psychiatry: Judgement and insight appear normal. Mood &  affect appropriate.    Data Reviewed:      Latest Ref Rng & Units 09/02/2024    5:10 AM 09/01/2024    3:20 PM 01/20/2022    2:07 AM  CBC  WBC 4.0 - 10.5 K/uL 8.1  10.2  8.3   Hemoglobin 13.0 - 17.0 g/dL 86.6  85.2  85.4   Hematocrit 39.0 - 52.0 % 39.6  42.0  41.8   Platelets 150 - 400 K/uL 264  282  207       Latest Ref  Rng & Units 09/02/2024    5:10 AM 09/01/2024    3:20 PM 01/20/2022    2:07 AM  BMP  Glucose 70 - 99 mg/dL 780  554  825   BUN 6 - 20 mg/dL 7  10  10    Creatinine 0.61 - 1.24 mg/dL 9.38  9.22  9.20   Sodium 135 - 145 mmol/L 135  132  136   Potassium 3.5 - 5.1 mmol/L 3.4  4.0  3.8   Chloride 98 - 111 mmol/L 104  98  104   CO2 22 - 32 mmol/L 24  23  24    Calcium  8.9 - 10.3 mg/dL 8.1  8.8  8.5    US  ARTERIAL ABI (SCREENING LOWER EXTREMITY) Result Date: 09/03/2024 CLINICAL DATA:  48 year old male with history of right foot swelling. EXAM: NONINVASIVE PHYSIOLOGIC VASCULAR STUDY OF BILATERAL LOWER EXTREMITIES TECHNIQUE: Evaluation of both lower extremities were performed at rest, including calculation of ankle-brachial indices with single level Doppler, pressure and pulse volume recording. COMPARISON:  None Available. FINDINGS: Right ABI:  1.25 Left ABI:  1.19 Right Lower Extremity:  Normal arterial waveforms at the ankle. Left Lower Extremity:  Normal arterial waveforms at the ankle. IMPRESSION: Normal ankle-brachial indices. Ester Sides, MD [facility] Electronically Signed   By: Ester Sides M.D.   On: 09/03/2024 07:58   Family Communication: Discussed with patient, understand and agree. All questions answered.  No family present On today's evaluation.  Disposition: Status is: Inpatient Remains inpatient appropriate because: IV antibiotics, ABI, blood sugar control   Planned Discharge Destination: Home   Time spent: 50 minutes  Author: Eric Nunnery, MD 09/03/2024 9:47 PM Secure chat 7am to 7pm For on call review [email or web address].

## 2024-09-03 NOTE — Plan of Care (Signed)

## 2024-09-03 NOTE — Inpatient Diabetes Management (Addendum)
 Inpatient Diabetes Program Recommendations  AACE/ADA: New Consensus Statement on Inpatient Glycemic Control (2015)  Target Ranges:  Prepandial:   less than 140 mg/dL      Peak postprandial:   less than 180 mg/dL (1-2 hours)      Critically ill patients:  140 - 180 mg/dL   Lab Results  Component Value Date   GLUCAP 319 (H) 09/03/2024   HGBA1C 12.3 (H) 09/01/2024    Review of Glycemic Control  Latest Reference Range & Units 09/02/24 16:38 09/02/24 20:37 09/03/24 00:14 09/03/24 04:40 09/03/24 07:32 09/03/24 11:12  Glucose-Capillary 70 - 99 mg/dL 792 (H) 723 (H) 778 (H) 183 (H) 198 (H) 319 (H)  (H): Data is abnormally high  Diabetes history: DM2 Outpatient Diabetes medications: None Current orders for Inpatient glycemic control: Novolog  0-15 units Q4H, Lantus  15 units at bedtime, Novolog  3 units tID    Inpatient Diabetes Program Recommendations:    Lantus  20 units every day Novolog  5 units TID with meals  Novolog  0-15 units TID and 0-5 units at bedtime  Please use each patient interaction to provide diabetes education. Please review Living Well with Diabetes booklet with the patient, have patient watch patient education videos on diabetes, and instruct on insulin  administration. Please allow patient to be actively engaged with diabetes management by allowing patient to check own glucose and self-administer insulin  injections. Diabetes Coordinator will follow up with patient and reinforce diabetes education.   Discharge Recommendations: Other recommendations: Metformin  500 mg BID Long acting recommendations: Insulin  Glargine (LANTUS ) Solostar Pen to be determined at discharge  Short acting recommendations:  Meal + Correction coverage Insulin  lispro (HUMALOG ) KwikPen  Sensitive Scale.  to be determined at discharge Supply/Referral recommendations: Glucometer Test strips Lancet device Lancets Pen needles - standard   Use Adult Diabetes Insulin  Treatment Post Discharge order  set.  Thank you, Wyvonna Pinal, MSN, CDCES Diabetes Coordinator Inpatient Diabetes Program [phone] (team pager from 8a-5p)

## 2024-09-04 DIAGNOSIS — E1165 Type 2 diabetes mellitus with hyperglycemia: Secondary | ICD-10-CM | POA: Diagnosis not present

## 2024-09-04 DIAGNOSIS — E11628 Type 2 diabetes mellitus with other skin complications: Secondary | ICD-10-CM | POA: Diagnosis not present

## 2024-09-04 DIAGNOSIS — S98132A Complete traumatic amputation of one left lesser toe, initial encounter: Secondary | ICD-10-CM | POA: Diagnosis not present

## 2024-09-04 DIAGNOSIS — L089 Local infection of the skin and subcutaneous tissue, unspecified: Secondary | ICD-10-CM | POA: Diagnosis not present

## 2024-09-04 DIAGNOSIS — E11621 Type 2 diabetes mellitus with foot ulcer: Secondary | ICD-10-CM | POA: Diagnosis present

## 2024-09-04 LAB — GLUCOSE, CAPILLARY
Glucose-Capillary: 190 mg/dL — ABNORMAL HIGH (ref 70–99)
Glucose-Capillary: 208 mg/dL — ABNORMAL HIGH (ref 70–99)
Glucose-Capillary: 208 mg/dL — ABNORMAL HIGH (ref 70–99)
Glucose-Capillary: 276 mg/dL — ABNORMAL HIGH (ref 70–99)
Glucose-Capillary: 291 mg/dL — ABNORMAL HIGH (ref 70–99)
Glucose-Capillary: 299 mg/dL — ABNORMAL HIGH (ref 70–99)

## 2024-09-04 NOTE — Inpatient Diabetes Management (Addendum)
 Inpatient Diabetes Program Recommendations  Review of Glycemic Control  Latest Reference Range & Units 09/03/24 07:32 09/03/24 11:12 09/03/24 16:06 09/03/24 19:34 09/04/24 00:18 09/04/24 03:21 09/04/24 07:23 09/04/24 11:05  Glucose-Capillary 70 - 99 mg/dL 801 (H) 680 (H) 758 (H) 305 (H) 208 (H) 190 (H) 208 (H) 299 (H)  (H): Data is abnormally high  Diabetes history: DM2 Outpatient Diabetes medications: None Current orders for Inpatient glycemic control: Novolog  0-15 units Q4H, Lantus  20 units at bedtime, Novolog  3 units TID  Inpatient Diabetes Program Recommendations:    Lantus  24 units every day Novolog  6 units TID with meals  Novolog  0-15 units TID and 0-5 units at bedtime  Spoke with patient on the phone.  Educated patient on insulin  pen use at home. Reviewed contents of insulin  flexpen starter kit. Reviewed all steps of insulin  pen including attachment of needle, 2-unit air shot, dialing up dose, giving injection, removing needle, disposal of sharps, storage of unused insulin , disposal of insulin  etc. Also reviewed troubleshooting with insulin  pen. MD to give patient Rxs for insulin  pens and insulin  pen needles.  Attached a video to exit care on how to administer insulin  using the pen.  Reviewed hypoglycemia, < 70 mg/dL, signs, symptoms and treatments.    Nursing, please teach the insulin  pen using the insulin  pen station.  Secure chat sent to RN on 9/10 asking for teaching.     Discharge Recommendations: Other recommendations: Metformin  500 mg BID Long acting recommendations: Insulin  Glargine (LANTUS ) Solostar Pen to be determined at discharge  Short acting recommendations:  Meal + Correction coverage Insulin  lispro (HUMALOG ) KwikPen  Sensitive Scale.  to be determined at discharge Supply/Referral recommendations: Glucometer Test strips Lancet device Lancets Pen needles - standard   Use Adult Diabetes Insulin  Treatment Post Discharge order set.  Thank you, Wyvonna Pinal,  MSN, CDCES Diabetes Coordinator Inpatient Diabetes Program [phone] (team pager from 8a-5p)

## 2024-09-04 NOTE — Plan of Care (Signed)

## 2024-09-04 NOTE — Progress Notes (Signed)
 Progress Note   Patient: John Church FMW:[id number] DOB: [birth year] DOA: 09/01/2024     3 DOS: the patient was seen and examined on 09/04/2024   Brief hospital course: John Church is a 47 y.o. male with medical history significant for diabetes mellitus, non complaint presented to the ED with complaints of swelling and redness to his fourth toe of his left foot extending towards his ankle. States he has no sensation in his lower extremities. He does not follow with a podiatrist, and has not seen his outpatient provider in about 2 years, and so is not taking any medications for his diabetes. In the ED patient is hemodynamically stable, x-ray showed left foot soft tissue swelling, MRI foot showed no spacious findings for osteomyelitis.  Patient got IV Unasyn , linezolid  therapy admitted to TRH service for further management evaluation.  Assessment and Plan: * Diabetic left foot infection (HCC) -Cellulitis to left foot. Scarring and eschar to fourth left toe, no open wound or drainage appreciated.   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 MRI without definitive findings of osteomyelitis. Follow-up blood cultures. ABI pending.  -Continue with IV linezolid  and IV ceftriaxone  therapy.  -outpatient follow up with podiatrist recommended.  Uncontrolled type 2 Diabetes mellitus- -Noncompliant with medications. -Blood sugars upon presentation around 445; acute infection most likely involved in elevated blood sugar levels. -Continue to follow CBG fluctuation and further adjust hypoglycemic regimen as needed.  Will continue adjusted dose of Lantus , continue sliding scale insulin  and NovoLog  meal coverage while inpatient. - Planning to discharge home on metformin  and Lantus . - Patient will require close monitoring of his CBGs with further adjustment to hypoglycemic regimen as required.  Hypokalemia: -Continue electrolytes repletion as needed - Patient advised to maintain adequate hydration and  nutrition.  Diet:  Diet Orders (From admission, onward)     Start     Ordered   09/01/24 2213  Diet heart healthy/carb modified Room service appropriate? Yes; Fluid consistency: Thin  Diet effective now       Question Answer Comment  Diet-HS Snack? Nothing   Room service appropriate? Yes   Fluid consistency: Thin      09/01/24 2212           DVT prophylaxis: enoxaparin  (LOVENOX ) injection 40 mg Start: 09/02/24 1000 SCDs Start: 09/01/24 2213  Level of care: Med-Surg   Code Status: Full Code  Subjective: No fever, no chest pain, no nausea, no vomiting.  Feeling better and reported improvement in his left fourth toe wound/cellulitic process.  Physical Exam: Vitals:   09/03/24 1222 09/03/24 1948 09/04/24 0317 09/04/24 1307  BP: (!) 146/94 (!) 143/84 (!) 148/88 (!) 147/93  Pulse: 80  76 97  Resp: 18 16 15 20   Temp: 98.4 F (36.9 C) 98.8 F (37.1 C)  98.1 F (36.7 C)  TempSrc: Oral Oral  Oral  SpO2: 97% 97% 99% 98%  Weight:      Height:       General exam: Alert, awake, oriented x 3; no fever, no chest pain, no nausea, no vomiting. Respiratory system: Good saturation on room air. Cardiovascular system:RRR. No murmurs, rubs, gallops. Gastrointestinal system: Abdomen is nondistended, soft and nontender. No organomegaly or masses felt. Normal bowel sounds heard. Central nervous system: No focal neurological deficits. Extremities: No cyanosis or clubbing; first ray amputation well-healed in his left foot.  Left fourth toe demonstrating improvement in cellulitic/wound process.  Less redness and decreased suppuration. Skin: No petechiae. Psychiatry: Judgement and insight  appear normal. Mood & affect appropriate.   Data Reviewed:     Latest Ref Rng & Units 09/02/2024    5:10 AM 09/01/2024    3:20 PM 01/20/2022    2:07 AM  CBC  WBC 4.0 - 10.5 K/uL 8.1  10.2  8.3   Hemoglobin 13.0 - 17.0 g/dL 86.6  85.2  85.4   Hematocrit 39.0 - 52.0 % 39.6  42.0  41.8   Platelets 150 - 400  K/uL 264  282  207       Latest Ref Rng & Units 09/02/2024    5:10 AM 09/01/2024    3:20 PM 01/20/2022    2:07 AM  BMP  Glucose 70 - 99 mg/dL 780  554  825   BUN 6 - 20 mg/dL 7  10  10    Creatinine 0.61 - 1.24 mg/dL 9.38  9.22  9.20   Sodium 135 - 145 mmol/L 135  132  136   Potassium 3.5 - 5.1 mmol/L 3.4  4.0  3.8   Chloride 98 - 111 mmol/L 104  98  104   CO2 22 - 32 mmol/L 24  23  24    Calcium  8.9 - 10.3 mg/dL 8.1  8.8  8.5    Family Communication: Discussed with patient, understand and agree. All questions answered.  No family present On today's evaluation.  Disposition: Status is: Inpatient Remains inpatient appropriate because: IV antibiotics, ABI, blood sugar control  Planned Discharge Destination: Home   Time spent: 50 minutes  Author: Eric Nunnery, MD 09/04/2024 5:57 PM Secure chat 7am to 7pm For on call review [email or web address].

## 2024-09-04 NOTE — Plan of Care (Signed)
  Problem: Clinical Measurements: Goal: Ability to maintain clinical measurements within normal limits will improve Outcome: Progressing Goal: Diagnostic test results will improve Outcome: Progressing   

## 2024-09-05 DIAGNOSIS — L089 Local infection of the skin and subcutaneous tissue, unspecified: Secondary | ICD-10-CM | POA: Diagnosis not present

## 2024-09-05 DIAGNOSIS — E1165 Type 2 diabetes mellitus with hyperglycemia: Secondary | ICD-10-CM

## 2024-09-05 DIAGNOSIS — E11628 Type 2 diabetes mellitus with other skin complications: Secondary | ICD-10-CM | POA: Diagnosis not present

## 2024-09-05 DIAGNOSIS — S98132A Complete traumatic amputation of one left lesser toe, initial encounter: Secondary | ICD-10-CM | POA: Diagnosis not present

## 2024-09-05 LAB — GLUCOSE, CAPILLARY
Glucose-Capillary: 172 mg/dL — ABNORMAL HIGH (ref 70–99)
Glucose-Capillary: 184 mg/dL — ABNORMAL HIGH (ref 70–99)
Glucose-Capillary: 205 mg/dL — ABNORMAL HIGH (ref 70–99)
Glucose-Capillary: 263 mg/dL — ABNORMAL HIGH (ref 70–99)

## 2024-09-05 MED ORDER — ADVOCATE INSULIN PEN NEEDLES 31G X 8 MM MISC: 2 refills | Status: AC

## 2024-09-05 MED ORDER — INSULIN LISPRO (1 UNIT DIAL) 100 UNIT/ML (KWIKPEN): 7.0000 [IU] | PEN_INJECTOR | Freq: Three times a day (TID) | SUBCUTANEOUS | 5 refills | Status: AC

## 2024-09-05 MED ORDER — METFORMIN HCL ER (OSM) 500 MG PO TB24: 500.0000 mg | ORAL_TABLET | Freq: Two times a day (BID) | ORAL | 2 refills | Status: AC

## 2024-09-05 MED ORDER — LINEZOLID 600 MG PO TABS: 600.0000 mg | ORAL_TABLET | Freq: Two times a day (BID) | ORAL | 0 refills | Status: AC

## 2024-09-05 MED ORDER — FREESTYLE LIBRE 3 PLUS SENSOR MISC: 1 refills | Status: AC

## 2024-09-05 MED ORDER — INSULIN GLARGINE 100 UNIT/ML SOLOSTAR PEN: 24.0000 [IU] | PEN_INJECTOR | Freq: Every day | SUBCUTANEOUS | 5 refills | Status: AC

## 2024-09-05 MED ORDER — CEPHALEXIN 500 MG PO CAPS: 500.0000 mg | ORAL_CAPSULE | Freq: Three times a day (TID) | ORAL | 0 refills | Status: AC

## 2024-09-05 NOTE — Inpatient Diabetes Management (Addendum)
 Inpatient Diabetes Program Recommendations  AACE/ADA: New Consensus Statement on Inpatient Glycemic Control  Target Ranges:  Prepandial:   less than 140 mg/dL      Peak postprandial:   less than 180 mg/dL (1-2 hours)      Critically ill patients:  140 - 180 mg/dL    Latest Reference Range & Units 09/05/24 00:00 09/05/24 04:09 09/05/24 07:15  Glucose-Capillary 70 - 99 mg/dL 794 (H)  Novolog  5 units 184 (H)  Novolog  3 units 172 (H)  Novolog  6 units    Latest Reference Range & Units 09/04/24 07:23 09/04/24 11:05 09/04/24 16:04 09/04/24 20:06  Glucose-Capillary 70 - 99 mg/dL 791 (H)  Novolog  8 units  Lantus  20 units 299 (H)  Novolog  11 units 276 (H)  Novolog  11 units 291 (H)  Novolog  8 units    Review of Glycemic Control  Diabetes history: DM2 Outpatient Diabetes medications: None in 2 years Current orders for Inpatient glycemic control: Lantus  20 units daily, Novolog  0-15 units Q4H, Novolog  3 units TID with meals  Inpatient Diabetes Program Recommendations:    Insulin : Please consider increasing Lantus  to 30 units daily, increasing meal coverage to Novolog  5 units TID with meals, and changing CBGs to AC&HS and Novolog  0-15 units to AC&HS.  Addendum 09/05/24@12 :25-Spoke with patient at bedside. Patient has Living Well with DM book at bedside and has been reading. Reviewed basics of DM management, glucose goals, A1C goals, hyperglycemia and hypoglycemia along with treatment, carb modified diet, and stressed importance of improving and maintaining DM control. Educated patient on insulin  pen use at home. Reviewed all steps of insulin  pen including attachment of needle, 2-unit air shot, dialing up dose, giving injection, removing needle, disposal of sharps, storage of unused insulin , disposal of insulin  etc. Patient able to provide successful return demonstration. Patient reports that he received a notification from his insurance that they approved FreeStyle Libre 3 plus sensors.  Dr.  Ricky provided permission to give patient samples of FreeStyle Libre 3 sensors. Educated patient on FreeStyle Libre3 CGM regarding application and changing CGM sensor (alternate every 14 days on back of arms), 1 hour warm-up, how to scan sensor to start a new sensor, and how to use app to check glucose.   Patient has been given Freestyle Libre3 sensor samples(2) and he was able to apply sensor to back of left upper arm.  Provided educational packet regarding FreeStyle Libre3 CGM.  Patient plans to use iphone FreeStyle Libre3 app to read FreeStyle Libre sensor and was able to download the app and create an account. Informed patient that it would be requested that attending provider provide Rx for first month of FreeStyle Libre3 sensors and that he have PCP continue to provide Rx for FreeStyle Libre3 sensors going forward.Patient reports he is planning to go back to [place] [facility], [place] at [place] in [place]. Asked that he call and make appointment as soon as possible especially since he is discharging new to insulin .  Discussed Lantus , Humalog , and Metformin  in case all prescribed at discharge.  Patient verbalized understanding of information and has no questions at this time.  Discharge Recommendations: Other recommendations: Metformin  500 mg BID Long acting recommendations: Insulin  Glargine (LANTUS ) Solostar Pen 24 units QD  Short acting recommendations:  Meal + Correction coverage Insulin  lispro (HUMALOG ) KwikPen  Sensitive Scale.  Novolog  6 units TID + Correction Supply/Referral recommendations: Glucometer Test strips Lancet device Lancets Pen needles - standard Freestyle Libre 3 plus sensors (#[id number])  Use Adult Diabetes Insulin  Treatment Post Discharge  order set.  Thanks, Earnie Gainer, RN, MSN, CDCES Diabetes Coordinator Inpatient Diabetes Program [phone] (Team Pager from 8am to 5pm)

## 2024-09-05 NOTE — Plan of Care (Signed)

## 2024-09-05 NOTE — Discharge Summary (Signed)
 Physician Discharge Summary   Patient: John Church MRN: [id number] DOB: Feb 24, 1976  Admit date:     09/01/2024  Discharge date: 09/05/24  Discharge Physician: Eric Nunnery   PCP: Jolee Elsie GORMAN, PA   Recommendations at discharge:  Reassess CBGs and A1c with further adjustment to hypoglycemia regimen as required. (Patient has been discharge on 3 times a day rapid insulin , metformin  and also daily long-acting Lantus ). Repeat basic metabolic panel to follow ultralights renal function Repeat CBC to assess WBCs/hemoglobin stability and trend. - Make sure patient follow-up with podiatrist as instructed for further evaluation and management of ongoing diabetic foot ulcer.  Discharge Diagnoses: Principal Problem:   Diabetic foot infection (HCC) Active Problems:   DM (diabetes mellitus) (HCC)   Amputated toe of left foot (HCC)   Diabetic foot ulcer (HCC)   Uncontrolled type 2 diabetes mellitus with hyperglycemia, without long-term current use of insulin  [facility])   Brief hospital course: John Church is a 48 y.o. male with medical history significant for diabetes mellitus, non complaint presented to the ED with complaints of swelling and redness to his fourth toe of his left foot extending towards his ankle. States he has no sensation in his lower extremities. He does not follow with a podiatrist, and has not seen his outpatient provider in about 2 years, and so is not taking any medications for his diabetes. In the ED patient is hemodynamically stable, x-ray showed left foot soft tissue swelling, MRI foot showed no spacious findings for osteomyelitis.  Patient got IV Unasyn , linezolid  therapy admitted to [facility] service for further management evaluation.  Assessment and Plan:  Diabetic left foot infection (HCC) -Cellulitis to left foot. Scarring and eschar to fourth left toe, no open wound or drainage appreciated.   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 MRI without definitive findings  of osteomyelitis. -ABI demonstrating good blood flow. -outpatient follow up with podiatrist recommended. -Patient has been discharge on Keflex  and linezolid  to complete antibiotic therapy as an outpatient. -Local wound care discussed with patient.   Uncontrolled type 2 Diabetes mellitus- -Noncompliant with medications prior to admission. -Blood sugars upon presentation around 445; acute infection most likely involved in elevated blood sugar levels. -Continue close outpatient monitoring of patient's CBGs/A1c with further adjustment to hypoglycemia regimen. - Patient has been discharged on lispro 3 times a day, metformin  twice a day and Lantus  once a day. - Patient advised to follow modified carbohydrate diet and maintain adequate hydration.   Hypokalemia: -Continue electrolytes repletion as needed - Patient advised to maintain adequate hydration and nutrition. - Repeat basic metabolic panel follow-up visit to assess electrolytes trend/stability.  Consultants: None Procedures performed: See below for x-ray report. Disposition: Home Diet recommendation: Modified carbohydrate diet.  DISCHARGE MEDICATION: Allergies as of 09/05/2024   No Known Allergies      Medication List     STOP taking these medications    NYQUIL PO       TAKE these medications    Advocate Insulin  Pen Needles 31G X 8 MM Misc Generic drug: Insulin  Pen Needle Use daily to inject insulin  as prescribed.   cephALEXin  500 MG capsule Commonly known as: KEFLEX  Take 1 capsule (500 mg total) by mouth 3 (three) times daily for 7 days.   FreeStyle Libre 3 Plus Sensor Misc Change sensor every 15 days.   insulin  glargine 100 UNIT/ML Solostar Pen Commonly known as: LANTUS  Inject 24 Units into the skin daily.   insulin  lispro 100 UNIT/ML KwikPen  Commonly known as: HUMALOG  Inject 7 Units into the skin 3 (three) times daily.   linezolid  600 MG tablet Commonly known as: ZYVOX  Take 1 tablet (600 mg total) by  mouth every 12 (twelve) hours for 7 days.   metformin  500 MG (OSM) 24 hr tablet Commonly known as: FORTAMET  Take 1 tablet (500 mg total) by mouth 2 (two) times daily with a meal.        Follow-up Information     [name], PA. Schedule an appointment as soon as possible for a visit in 10 day(s).   Specialty: Physician Assistant Contact information: [address] [place] [address] [phone]         [name], DPM. Schedule an appointment as soon as possible for a visit in 1 week(s).   Specialty: Podiatry Contact information: [address] [place] [place] [address] [phone]                Discharge Exam: Filed Weights   09/01/24 1433 09/01/24 2213  Weight: 113.4 kg 105.1 kg   General exam: Alert, awake, oriented x 3; no fever, no chest pain, no nausea, no vomiting. Respiratory system: Good saturation on room air. Cardiovascular system:RRR. No murmurs, rubs, gallops. Gastrointestinal system: Abdomen is nondistended, soft and nontender. No organomegaly or masses felt. Normal bowel sounds heard. Central nervous system: No focal neurological deficits. Extremities: No cyanosis or clubbing; first ray amputation well-healed in his left foot.  Left fourth toe demonstrating improvement in cellulitic/wound process.  Less redness and decreased suppuration. Skin: No petechiae. Psychiatry: Judgement and insight appear normal. Mood & affect appropriate.   Condition at discharge: Stable and improved.  The results of significant diagnostics from this hospitalization (including imaging, microbiology, ancillary and laboratory) are listed below for reference.   Imaging Studies: US  ARTERIAL ABI (SCREENING LOWER EXTREMITY) Result Date: 09/03/2024 CLINICAL DATA:  48 year old male with history of right foot swelling. EXAM: NONINVASIVE PHYSIOLOGIC VASCULAR STUDY OF BILATERAL LOWER EXTREMITIES TECHNIQUE: Evaluation of both lower extremities were performed at  rest, including calculation of ankle-brachial indices with single level Doppler, pressure and pulse volume recording. COMPARISON:  None Available. FINDINGS: Right ABI:  1.25 Left ABI:  1.19 Right Lower Extremity:  Normal arterial waveforms at the ankle. Left Lower Extremity:  Normal arterial waveforms at the ankle. IMPRESSION: Normal ankle-brachial indices. Ester Sides, MD [facility] Electronically Signed   By: Ester Sides M.D.   On: 09/03/2024 07:58   MR FOOT LEFT WO CONTRAST Result Date: 09/01/2024 CLINICAL DATA:  Left fourth toe infection. History of osteomyelitis. Prior great toe amputation. EXAM: MRI OF THE LEFT FOOT WITHOUT CONTRAST TECHNIQUE: Multiplanar, multisequence MR imaging of the left foot was performed. No intravenous contrast was administered. COMPARISON:  Radiographs 08/30/2024 and prior MRI 2023. FINDINGS: Poor distal fat saturation no definite MR findings suspicious for osteomyelitis. No findings for septic arthritis. Remote amputation of the great toe. 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 Diffuse subcutaneous soft tissue swelling/edema suggesting cellulitis. There are also changes of myofasciitis no discrete fluid collection to suggest subcutaneous abscess or pyomyositis. Mild to moderate midfoot degenerative changes mainly involving the navicular articulation with the lateral cuneiform. Degenerative changes at the calcaneocuboid joint with a subchondral cyst or geode. CT IMPRESSION: 1. Poor distal fat saturation but no definite MR findings suspicious for osteomyelitis. 2. Remote amputation of the great toe. 3. 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 4.  Diffuse subcutaneous soft tissue swelling/edema suggesting cellulitis. There are also changes of myofasciitis. No discrete fluid collection to suggest subcutaneous abscess or pyomyositis.  Electronically Signed   By: MYRTIS Stammer M.D.   On: 09/01/2024 21:41   DG Toe 4th Left Result Date: 09/01/2024 CLINICAL DATA:  Left fourth toe infection. History of osteomyelitis. Prior great toe amputation. Swelling drainage. EXAM: LEFT FOURTH TOE COMPARISON:  01/18/2022 FINDINGS: Great toe amputation. Ossicle or posttraumatic fragment medial to the third toe PIPs joint. Prior healed fracture of the proximal phalanx second toe. 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 Plantar and Achilles calcaneal spurs. Substantial dorsal spurring along the midfoot and Lisfranc joint. Dorsal subcutaneous edema along the forefoot. Soft tissue swelling of the fourth toe. No definite bony destructive findings to indicate conventional radiographic evidence of active osteomyelitis. IMPRESSION: 1. Soft tissue swelling of the fourth toe without definite bony destructive findings to indicate conventional radiographic evidence of active osteomyelitis. 2. Great toe amputation. 3. 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 4. Dorsal subcutaneous edema along the forefoot. 5. Plantar and Achilles calcaneal spurs. 6. Substantial dorsal spurring along the midfoot and Lisfranc joint. Electronically Signed   By: Ryan Salvage M.D.   On: 09/01/2024 15:35    Microbiology: Results for orders placed or performed during the hospital encounter of 09/01/24  Culture, blood (single)     Status: None (Preliminary result)   Collection Time: 09/01/24  3:20 PM   Specimen: BLOOD  Result Value Ref Range Status   Specimen Description BLOOD RIGHT ANTECUBITAL  Final   Special Requests   Final    Blood Culture adequate volume BOTTLES DRAWN AEROBIC AND ANAEROBIC   Culture   Final    NO GROWTH 4 DAYS Performed at [facility], [address], [place], [place] [address]    Report Status PENDING   Incomplete  Culture, blood (single)     Status: None (Preliminary result)   Collection Time: 09/01/24  8:50 PM   Specimen: BLOOD  Result Value Ref Range Status   Specimen Description   Final    BLOOD LEFT ANTECUBITAL Performed at [facility], [address] [place], [place] [address]    Special Requests   Final    AEROBIC BOTTLE ONLY Blood Culture results may not be optimal due to an inadequate volume of blood received in culture bottles Performed at [facility], [address], [place], [place] [address]    Culture  Setup Time   Final    AEROBIC BOTTLE ONLY GRAM POSITIVE RODS Gram Stain Report Called to,Read Back By and Verified With: JADA MUSE @ 1839 ON 09/04/24 JAYSON BUCK Performed at [facility], [address] [place], [place] [address]    Culture   Final    GRAM POSITIVE RODS CULTURE REINCUBATED FOR BETTER GROWTH Performed at [facility], [address], [place], [place] [address]    Report Status PENDING  Incomplete    Labs: CBC: Recent Labs  Lab 09/01/24 1520 09/02/24 0510  WBC 10.2 8.1  NEUTROABS 7.0  --   HGB 14.7 13.3  HCT 42.0 39.6  MCV 84.5 85.5  PLT 282 264   Basic Metabolic Panel: Recent Labs  Lab 09/01/24 1520 09/02/24 0510  NA 132* 135  K 4.0 3.4*  CL 98 104  CO2 23 24  GLUCOSE 445* 219*  BUN 10 7  CREATININE 0.77 0.61  CALCIUM  8.8*  8.1*   Liver Function Tests: Recent Labs  Lab 09/01/24 1520  AST 19  ALT 19  ALKPHOS 71  BILITOT 1.2  PROT 7.9  ALBUMIN 3.4*   CBG: Recent Labs  Lab 09/04/24 2006 09/05/24 0000 09/05/24 0409 09/05/24 0715 09/05/24 1100  GLUCAP 291* 205* 184* 172* 263*    Discharge time spent:  35 minutes.  Signed: Eric Nunnery, MD [facility] 09/05/2024

## 2024-09-06 LAB — CULTURE, BLOOD (SINGLE)
Culture: NO GROWTH
Special Requests: ADEQUATE

## 2024-09-08 LAB — CULTURE, BLOOD (SINGLE)

## 2024-11-27 ENCOUNTER — Emergency Department (HOSPITAL_COMMUNITY)
Admission: EM | Admit: 2024-11-27 | Discharge: 2024-11-27 | Disposition: A | Discharge status: Home / Self Care | Attending: Emergency Medicine | Admitting: Emergency Medicine

## 2024-11-27 ENCOUNTER — Other Ambulatory Visit: Payer: Self-pay

## 2024-11-27 ENCOUNTER — Encounter (HOSPITAL_COMMUNITY): Payer: Self-pay

## 2024-11-27 DIAGNOSIS — M62838 Other muscle spasm: Secondary | ICD-10-CM | POA: Insufficient documentation

## 2024-11-27 MED ORDER — IBUPROFEN 800 MG PO TABS: 800.0000 mg | ORAL_TABLET | Freq: Three times a day (TID) | ORAL | 0 refills | Status: DC

## 2024-11-27 MED ORDER — KETOROLAC TROMETHAMINE 60 MG/2ML IM SOLN
60.0000 mg | Freq: Once | INTRAMUSCULAR | Status: AC
  Administered 2024-11-27: 60 mg via INTRAMUSCULAR
  Filled 2024-11-27: qty 2

## 2024-11-27 MED ORDER — METHOCARBAMOL 500 MG PO TABS: 500.0000 mg | ORAL_TABLET | Freq: Three times a day (TID) | ORAL | 0 refills | Status: DC

## 2024-11-27 NOTE — Discharge Instructions (Signed)
 Continue to alternate ice and heat to your neck.  Take the medication as directed.  Muscle laxer may cause drowsiness so do not operate machinery or drive while taking this medication.  You may also apply over-the-counter 4% lidocaine  patches to the affected area.  Follow-up with your primary care provider or return to the emergency department for any new or worsening symptoms

## 2024-11-27 NOTE — ED Triage Notes (Signed)
 Pt arrived via POV from home c/o severe neck pain since waking up Monday morning. Pt reports his neck is throbbing and reports he must've slept wrong. Pt reports trying multiple different therapies to relieve the pain and tightness in his neck w/o relief.

## 2024-11-28 ENCOUNTER — Emergency Department (HOSPITAL_COMMUNITY)

## 2024-11-28 ENCOUNTER — Other Ambulatory Visit: Payer: Self-pay

## 2024-11-28 ENCOUNTER — Encounter (HOSPITAL_COMMUNITY): Payer: Self-pay | Admitting: *Deleted

## 2024-11-28 ENCOUNTER — Emergency Department (HOSPITAL_COMMUNITY)
Admission: EM | Admit: 2024-11-28 | Discharge: 2024-11-28 | Disposition: A | Discharge status: Home / Self Care | Attending: Emergency Medicine | Admitting: Emergency Medicine

## 2024-11-28 DIAGNOSIS — E119 Type 2 diabetes mellitus without complications: Secondary | ICD-10-CM | POA: Insufficient documentation

## 2024-11-28 DIAGNOSIS — J189 Pneumonia, unspecified organism: Secondary | ICD-10-CM

## 2024-11-28 DIAGNOSIS — J181 Lobar pneumonia, unspecified organism: Secondary | ICD-10-CM | POA: Insufficient documentation

## 2024-11-28 DIAGNOSIS — Z794 Long term (current) use of insulin: Secondary | ICD-10-CM | POA: Insufficient documentation

## 2024-11-28 LAB — COMPREHENSIVE METABOLIC PANEL WITH GFR
ALT: 14 U/L (ref 0–44)
AST: 15 U/L (ref 15–41)
Albumin: 4.6 g/dL (ref 3.5–5.0)
Alkaline Phosphatase: 115 U/L (ref 38–126)
Anion gap: 13 (ref 5–15)
BUN: 12 mg/dL (ref 6–20)
CO2: 26 mmol/L (ref 22–32)
Calcium: 9.8 mg/dL (ref 8.9–10.3)
Chloride: 94 mmol/L — ABNORMAL LOW (ref 98–111)
Creatinine, Ser: 0.87 mg/dL (ref 0.61–1.24)
GFR, Estimated: >60 mL/min (ref >60)
Glucose, Bld: 387 mg/dL — ABNORMAL HIGH (ref 70–99)
Potassium: 3.8 mmol/L (ref 3.5–5.1)
Sodium: 132 mmol/L — ABNORMAL LOW (ref 135–145)
Total Bilirubin: 1.3 mg/dL — ABNORMAL HIGH (ref 0.0–1.2)
Total Protein: 9.7 g/dL — ABNORMAL HIGH (ref 6.5–8.1)

## 2024-11-28 LAB — CBC WITH DIFFERENTIAL/PLATELET
Abs Immature Granulocytes: 0.08 K/uL — ABNORMAL HIGH (ref 0.00–0.07)
Basophils Absolute: 0.1 K/uL (ref 0.0–0.1)
Basophils Relative: 1 %
Eosinophils Absolute: 0.3 K/uL (ref 0.0–0.5)
Eosinophils Relative: 2 %
HCT: 47.2 % (ref 39.0–52.0)
Hemoglobin: 16.3 g/dL (ref 13.0–17.0)
Immature Granulocytes: 1 %
Lymphocytes Relative: 12 %
Lymphs Abs: 1.8 K/uL (ref 0.7–4.0)
MCH: 29.1 pg (ref 26.0–34.0)
MCHC: 34.5 g/dL (ref 30.0–36.0)
MCV: 84.1 fL (ref 80.0–100.0)
Monocytes Absolute: 0.8 K/uL (ref 0.1–1.0)
Monocytes Relative: 5 %
Neutro Abs: 12 K/uL — ABNORMAL HIGH (ref 1.7–7.7)
Neutrophils Relative %: 79 %
Platelets: 320 K/uL (ref 150–400)
RBC: 5.61 MIL/uL (ref 4.22–5.81)
RDW: 12.4 % (ref 11.5–15.5)
WBC: 15 K/uL — ABNORMAL HIGH (ref 4.0–10.5)
nRBC: 0 % (ref 0.0–0.2)

## 2024-11-28 MED ORDER — DOXYCYCLINE HYCLATE 100 MG PO CAPS: 100.0000 mg | ORAL_CAPSULE | Freq: Two times a day (BID) | ORAL | 0 refills | Status: DC

## 2024-11-28 MED ORDER — OXYCODONE-ACETAMINOPHEN 5-325 MG PO TABS: 1.0000 | ORAL_TABLET | Freq: Four times a day (QID) | ORAL | 0 refills | Status: DC | PRN

## 2024-11-28 MED ORDER — DOXYCYCLINE HYCLATE 100 MG PO TABS
100.0000 mg | ORAL_TABLET | Freq: Once | ORAL | Status: AC
  Administered 2024-11-28: 100 mg via ORAL
  Filled 2024-11-28: qty 1

## 2024-11-28 MED ORDER — HYDROMORPHONE HCL 1 MG/ML IJ SOLN
0.5000 mg | Freq: Once | INTRAMUSCULAR | Status: AC
  Administered 2024-11-28: 0.5 mg via INTRAVENOUS
  Filled 2024-11-28: qty 0.5

## 2024-11-28 MED ORDER — LORAZEPAM 2 MG/ML IJ SOLN
0.5000 mg | Freq: Once | INTRAMUSCULAR | Status: AC
  Administered 2024-11-28: 0.5 mg via INTRAVENOUS
  Filled 2024-11-28: qty 1

## 2024-11-28 MED ORDER — SODIUM CHLORIDE 0.9 % IV BOLUS
1000.0000 mL | Freq: Once | INTRAVENOUS | Status: AC
  Administered 2024-11-28: 1000 mL via INTRAVENOUS

## 2024-11-28 MED ORDER — KETOROLAC TROMETHAMINE 30 MG/ML IJ SOLN
30.0000 mg | Freq: Once | INTRAMUSCULAR | Status: AC
  Administered 2024-11-28: 30 mg via INTRAVENOUS
  Filled 2024-11-28: qty 1

## 2024-11-28 NOTE — Discharge Instructions (Signed)
 Follow-up with your family doctor next week.  Return to the hospital if you get worse

## 2024-11-28 NOTE — ED Provider Notes (Signed)
 [place] EMERGENCY DEPARTMENT AT [facility] Provider Note   CSN: [id number] Arrival date & time: 11/28/24  1807     Patient presents with: Neck Pain   John Church is a 48 y.o. male.   Patient has a history of diabetes.  He presents with a cough and pain in his upper back going to up to his neck and his head.  No fevers no chills no vomiting.  The history is provided by the patient and medical records. No language interpreter was used.  Cough Cough characteristics:  Productive Sputum characteristics:  Nondescript Severity:  Moderate Onset quality:  Sudden Timing:  Constant Progression:  Waxing and waning Chronicity:  New Smoker: no   Context: not animal exposure   Relieved by:  Nothing Associated symptoms: no chest pain, no eye discharge, no headaches and no rash        Prior to Admission medications   Medication Sig Start Date End Date Taking? Authorizing Provider  doxycycline  (VIBRAMYCIN ) 100 MG capsule Take 1 capsule (100 mg total) by mouth 2 (two) times daily. One po bid x 7 days 11/28/24  Yes Abdon Petrosky, MD  ibuprofen  (ADVIL ) 800 MG tablet Take 1 tablet (800 mg total) by mouth 3 (three) times daily. Take with food 11/27/24  Yes Triplett, Tammy, PA-C  insulin  lispro (HUMALOG ) 100 UNIT/ML KwikPen Inject 7 Units into the skin 3 (three) times daily. 09/05/24  Yes Ricky Fines, MD  Insulin  Pen Needle (ADVOCATE INSULIN  PEN NEEDLES) 31G X 8 MM MISC Use daily to inject insulin  as prescribed. 09/05/24  Yes Ricky Fines, MD  metformin  (FORTAMET ) 500 MG (OSM) 24 hr tablet Take 1 tablet (500 mg total) by mouth 2 (two) times daily with a meal. 09/05/24  Yes Ricky Fines, MD  methocarbamol  (ROBAXIN ) 500 MG tablet Take 1 tablet (500 mg total) by mouth 3 (three) times daily. 11/27/24  Yes Triplett, Tammy, PA-C  oxyCODONE -acetaminophen  (PERCOCET/ROXICET) 5-325 MG tablet Take 1 tablet by mouth every 6 (six) hours as needed for severe pain (pain score 7-10). 11/28/24  Yes  Kloie Whiting, MD  sertraline (ZOLOFT) 25 MG tablet Take 25 mg by mouth daily. 10/31/24  Yes [provider]  Continuous Glucose Sensor (FREESTYLE LIBRE 3 PLUS SENSOR) MISC Change sensor every 15 days. Patient not taking: Reported on 11/28/2024 09/05/24   Ricky Fines, MD  cyclobenzaprine (FLEXERIL) 5 MG tablet Take 5 mg by mouth at bedtime as needed. Patient not taking: Reported on 11/28/2024 10/01/24   [provider]  insulin  glargine (LANTUS ) 100 UNIT/ML Solostar Pen Inject 24 Units into the skin daily. 09/05/24   Ricky Fines, MD    Allergies: Patient has no active allergies.    Review of Systems  Constitutional:  Negative for appetite change and fatigue.  HENT:  Negative for congestion, ear discharge and sinus pressure.        Neck pain  Eyes:  Negative for discharge.  Respiratory:  Positive for cough.   Cardiovascular:  Negative for chest pain.  Gastrointestinal:  Negative for abdominal pain and diarrhea.  Genitourinary:  Negative for frequency and hematuria.  Musculoskeletal:  Positive for back pain.  Skin:  Negative for rash.  Neurological:  Negative for seizures and headaches.  Psychiatric/Behavioral:  Negative for hallucinations.     Updated Vital Signs BP (!) 165/94   Pulse (!) 106   Temp 98.5 F (36.9 C) (Oral)   Resp 18   Ht 6' 2 (1.88 m)   Wt 105.1 kg  SpO2 94%   BMI 29.75 kg/m   Physical Exam Vitals and nursing note reviewed.  Constitutional:      Appearance: He is well-developed.  HENT:     Head: Normocephalic.     Nose: Nose normal.     Mouth/Throat:     Mouth: Mucous membranes are moist.  Eyes:     General: No scleral icterus.    Conjunctiva/sclera: Conjunctivae normal.  Neck:     Thyroid: No thyromegaly.     Comments: Tenderness to posterior neck but supple. Cardiovascular:     Rate and Rhythm: Normal rate and regular rhythm.     Heart sounds: No murmur heard.    No friction rub. No gallop.  Pulmonary:     Breath sounds:  No stridor. No wheezing or rales.  Chest:     Chest wall: No tenderness.  Abdominal:     General: There is no distension.     Tenderness: There is no abdominal tenderness. There is no rebound.  Musculoskeletal:        General: Normal range of motion.     Cervical back: Neck supple.     Comments: Tenderness to upper thoracic spine  Lymphadenopathy:     Cervical: No cervical adenopathy.  Skin:    Findings: No erythema or rash.  Neurological:     Mental Status: He is alert and oriented to person, place, and time.     Motor: No abnormal muscle tone.     Coordination: Coordination normal.  Psychiatric:        Behavior: Behavior normal.     (all labs ordered are listed, but only abnormal results are displayed) Labs Reviewed  CBC WITH DIFFERENTIAL/PLATELET - Abnormal; Notable for the following components:      Result Value   WBC 15.0 (*)    Neutro Abs 12.0 (*)    Abs Immature Granulocytes 0.08 (*)    All other components within normal limits  COMPREHENSIVE METABOLIC PANEL WITH GFR - Abnormal; Notable for the following components:   Sodium 132 (*)    Chloride 94 (*)    Glucose, Bld 387 (*)    Total Protein 9.7 (*)    Total Bilirubin 1.3 (*)    All other components within normal limits    EKG: None  Radiology: DG Chest 2 View Result Date: 11/28/2024 EXAM: 2 VIEW(S) XRAY OF THE CHEST 11/28/2024 09:49:49 PM COMPARISON: None available. CLINICAL HISTORY: sob FINDINGS: LUNGS AND PLEURA: No focal pulmonary opacity. No pleural effusion. No pneumothorax. HEART AND MEDIASTINUM: No acute abnormality of the cardiac and mediastinal silhouettes. BONES AND SOFT TISSUES: No acute osseous abnormality. IMPRESSION: 1. No acute cardiopulmonary process to account for shortness of breath. Electronically signed by: Dorethia Molt MD 11/28/2024 09:54 PM EST RP Workstation: [id number]   CT Cervical Spine Wo Contrast Result Date: 11/28/2024 EXAM: CT CERVICAL SPINE WITHOUT CONTRAST 11/28/2024 08:38:30 PM  TECHNIQUE: CT of the cervical spine was performed without the administration of intravenous contrast. Multiplanar reformatted images are provided for review. Automated exposure control, iterative reconstruction, and/or weight based adjustment of the mA/kV was utilized to reduce the radiation dose to as low as reasonably achievable. COMPARISON: None available. CLINICAL HISTORY: Cervical radiculopathy, no red flags. FINDINGS: CERVICAL SPINE: BONES AND ALIGNMENT: No acute fracture or traumatic malalignment. DEGENERATIVE CHANGES: Multilevel degenerative changes of the spine, most prominent at the C5-C6 level. Posterior disc osteophyte complex formation at this level. No associated severe osseous neural foraminal or central canal stenosis. At least mild to  moderate right osseous neural foraminal and central canal stenosis at the C5-C6 level. SOFT TISSUES: No prevertebral soft tissue swelling. LUNGS: Question patchy airspace opacities of the right apex. Correlate with chest x-ray PA and lateral view for further evaluation. IMPRESSION: 1. Questioned patchy airspace opacities in the right lung apex, recommend chest radiograph (PA and lateral) for further evaluation. 2. No acute c spine findings. Electronically signed by: Morgane Naveau MD 11/28/2024 08:54 PM EST RP Workstation: [id number]   CT Head Wo Contrast Result Date: 11/28/2024 EXAM: CT HEAD WITHOUT CONTRAST 11/28/2024 08:38:30 PM TECHNIQUE: CT of the head was performed without the administration of intravenous contrast. Automated exposure control, iterative reconstruction, and/or weight based adjustment of the mA/kV was utilized to reduce the radiation dose to as low as reasonably achievable. COMPARISON: None available. CLINICAL HISTORY: Headache, neuro deficit FINDINGS: BRAIN AND VENTRICLES: No acute hemorrhage. No evidence of acute infarct. No hydrocephalus. No extra-axial collection. No mass effect or midline shift. ORBITS: No acute abnormality. SINUSES: No  acute abnormality. SOFT TISSUES AND SKULL: No acute soft tissue abnormality. No skull fracture. IMPRESSION: 1. No acute intracranial abnormality. Electronically signed by: Morgane Naveau MD 11/28/2024 08:51 PM EST RP Workstation: [id number]     Procedures   Medications Ordered in the ED  doxycycline  (VIBRA -TABS) tablet 100 mg (has no administration in time range)  sodium chloride  0.9 % bolus 1,000 mL (1,000 mLs Intravenous New Bag/Given 11/28/24 2052)  ketorolac  (TORADOL ) 30 MG/ML injection 30 mg (30 mg Intravenous Given 11/28/24 2052)  HYDROmorphone  (DILAUDID ) injection 0.5 mg (0.5 mg Intravenous Given 11/28/24 2058)  LORazepam  (ATIVAN ) injection 0.5 mg (0.5 mg Intravenous Given 11/28/24 2100)                                    Medical Decision Making Amount and/or Complexity of Data Reviewed Labs: ordered. Radiology: ordered.  Risk Prescription drug management.   Patient with community-acquired pneumonia seen on CT scan.  Patient clinically has been coughing up sputum.  Patient has not been looking at the sputum.  Patient will be sent home on doxycycline  and Percocet for pain and will follow-up with his PCP next week.  He will get seen sooner if any problems.  Patient is not toxic or hypoxic     Final diagnoses:  Community acquired pneumonia of right upper lobe of lung    ED Discharge Orders          Ordered    doxycycline  (VIBRAMYCIN ) 100 MG capsule  2 times daily        11/28/24 2241    oxyCODONE -acetaminophen  (PERCOCET/ROXICET) 5-325 MG tablet  Every 6 hours PRN        11/28/24 2241               Dayle Mcnerney, MD 11/30/24 [phone]

## 2024-11-28 NOTE — ED Triage Notes (Signed)
 Pt with continued neck pain, pt states nothing is helping with his pain and his pain is worse today than yesterday. Left facial numbness which is new per pt and started this morning. Pt is crying in triage.

## 2024-11-29 NOTE — ED Provider Notes (Signed)
 [place] EMERGENCY DEPARTMENT AT [facility] Provider Note   CSN: [id number] Arrival date & time: 11/27/24  1059     Patient presents with: Neck Pain   John Church is a 48 y.o. male.    Neck Pain       John Church is a 47 y.o. male who presents to the Emergency Department complaining of bilateral neck pain.  Pain began after sleeping on the sofa.  Describes pain to both sides of his neck with movement.  Improves at rest.  He denies fever, chills, visual changes, numbness, weakness of his UE's and headache.    Prior to Admission medications   Medication Sig Start Date End Date Taking? Authorizing Provider  ibuprofen  (ADVIL ) 800 MG tablet Take 1 tablet (800 mg total) by mouth 3 (three) times daily. Take with food 11/27/24  Yes Dorien Bessent, PA-C  methocarbamol  (ROBAXIN ) 500 MG tablet Take 1 tablet (500 mg total) by mouth 3 (three) times daily. 11/27/24  Yes Urban Naval, PA-C  Continuous Glucose Sensor (FREESTYLE LIBRE 3 PLUS SENSOR) MISC Change sensor every 15 days. Patient not taking: Reported on 11/28/2024 09/05/24   Ricky Fines, MD  cyclobenzaprine (FLEXERIL) 5 MG tablet Take 5 mg by mouth at bedtime as needed. Patient not taking: Reported on 11/28/2024 10/01/24   [provider]  doxycycline  (VIBRAMYCIN ) 100 MG capsule Take 1 capsule (100 mg total) by mouth 2 (two) times daily. One po bid x 7 days 11/28/24   Zammit, Joseph, MD  insulin  glargine (LANTUS ) 100 UNIT/ML Solostar Pen Inject 24 Units into the skin daily. 09/05/24   Ricky Fines, MD  insulin  lispro (HUMALOG ) 100 UNIT/ML KwikPen Inject 7 Units into the skin 3 (three) times daily. 09/05/24   Ricky Fines, MD  Insulin  Pen Needle (ADVOCATE INSULIN  PEN NEEDLES) 31G X 8 MM MISC Use daily to inject insulin  as prescribed. 09/05/24   Ricky Fines, MD  metformin  (FORTAMET ) 500 MG (OSM) 24 hr tablet Take 1 tablet (500 mg total) by mouth 2 (two) times daily with a meal. 09/05/24   Ricky Fines, MD   oxyCODONE -acetaminophen  (PERCOCET/ROXICET) 5-325 MG tablet Take 1 tablet by mouth every 6 (six) hours as needed for severe pain (pain score 7-10). 11/28/24   Zammit, Joseph, MD  sertraline (ZOLOFT) 25 MG tablet Take 25 mg by mouth daily. 10/31/24   [provider]    Allergies: Patient has no active allergies.    Review of Systems  Musculoskeletal:  Positive for neck pain.  All other systems reviewed and are negative.   Updated Vital Signs BP (!) 151/90 (BP Location: Right Arm)   Pulse 78   Temp 98.2 F (36.8 C) (Oral)   Resp 16   Ht 6' 2 (1.88 m)   Wt 105.1 kg   SpO2 99%   BMI 29.75 kg/m   Physical Exam Vitals and nursing note reviewed.  Constitutional:      General: He is not in acute distress.    Appearance: Normal appearance.  Neck:     Meningeal: Kernig's sign absent.     Comments: Ttp of the bilateral cervical paraspinal muscles and bilateral trapezius muscles.  No nuchal rigidity or midline tenderness    Cardiovascular:     Rate and Rhythm: Normal rate and regular rhythm.     Pulses: Normal pulses.  Pulmonary:     Effort: Pulmonary effort is normal.     Breath sounds: Normal breath sounds. No wheezing.  Chest:  Chest wall: No tenderness.  Musculoskeletal:     Cervical back: Tenderness present. Pain with movement and muscular tenderness present. No spinous process tenderness.  Skin:    General: Skin is warm.     Capillary Refill: Capillary refill takes less than 2 seconds.     Findings: No rash.  Neurological:     General: No focal deficit present.     Mental Status: He is alert.     Sensory: No sensory deficit.     Motor: No weakness.     (all labs ordered are listed, but only abnormal results are displayed) Labs Reviewed - No data to display  EKG: None  Radiology:    Procedures   Medications Ordered in the ED  ketorolac  (TORADOL ) injection 60 mg (60 mg Intramuscular Given 11/27/24 1345)                                     Medical Decision Making Pt here with bilateral neck pain with pain into bilateral trapezius muscles.  NV intact.  5/5 motor strength of BUE's,  denies visual changes, weakness or numbness of the face or extremities.  No headache and he is non toxic appearing.    Likely torticollis, doubt cervical radiculopathy, intercranial process or meningitis   Amount and/or Complexity of Data Reviewed Discussion of management or test interpretation with external provider(s): Pt well appearing.  NV intact.  No focal neuro deficits.    Likely MSK, no indication of emergent process.  Will treat symptomatically. Discussed return precautions    Risk Prescription drug management.        Final diagnoses:  Muscle spasms of neck    ED Discharge Orders          Ordered    methocarbamol  (ROBAXIN ) 500 MG tablet  3 times daily        11/27/24 1427    ibuprofen  (ADVIL ) 800 MG tablet  3 times daily        11/27/24 1427               Herlinda Milling, PA-C 11/29/24 1604    Freddi Hamilton, MD 12/01/24 1646

## 2024-12-03 DIAGNOSIS — M4652 Other infective spondylopathies, cervical region: Secondary | ICD-10-CM

## 2024-12-03 HISTORY — DX: Other infective spondylopathies, cervical region: M46.52

## 2024-12-24 ENCOUNTER — Emergency Department (HOSPITAL_COMMUNITY)
Admission: EM | Admit: 2024-12-24 | Discharge: 2024-12-24 | Disposition: A | Discharge status: Home / Self Care | Attending: Emergency Medicine | Admitting: Emergency Medicine

## 2024-12-24 ENCOUNTER — Encounter (HOSPITAL_COMMUNITY): Payer: Self-pay

## 2024-12-24 ENCOUNTER — Other Ambulatory Visit: Payer: Self-pay

## 2024-12-24 DIAGNOSIS — Z452 Encounter for adjustment and management of vascular access device: Secondary | ICD-10-CM | POA: Insufficient documentation

## 2024-12-24 MED ORDER — HEPARIN SOD (PORK) LOCK FLUSH 100 UNIT/ML IV SOLN
INTRAVENOUS | Status: AC
  Administered 2024-12-24: 500 [IU]
  Filled 2024-12-24: qty 5

## 2024-12-24 NOTE — ED Provider Notes (Signed)
 " [place] EMERGENCY DEPARTMENT AT [facility] Provider Note   CSN: [id number] Arrival date & time: 12/24/24  1230     Patient presents with: Vascular Access Problem   John Church is a 48 y.o. male.  Patient with PICC line in place for current treatment of epicondylitis of the cervical spine presents to the emergency department today for concerns of PICC line change.  He reports that he had PICC line initially placed while at a hospital in [place]  due to findings of septic arthritis and is currently taking a combination of ertapenem and daptomycin.  He reports the line is still flushing and functional.  He states that he was given supplies to have the dressing of the outside portion of the PICC line to be replaced as well as the external urinary tubing.  He states that he is having-getting a home health nurse to come in to have this replaced.  He is requesting assistance to have this replaced here.   HPI     Prior to Admission medications  Medication Sig Start Date End Date Taking? Authorizing Provider  Continuous Glucose Sensor (FREESTYLE LIBRE 3 PLUS SENSOR) MISC Change sensor every 15 days. Patient not taking: Reported on 11/28/2024 09/05/24   Ricky Fines, MD  cyclobenzaprine (FLEXERIL) 5 MG tablet Take 5 mg by mouth at bedtime as needed. Patient not taking: Reported on 11/28/2024 10/01/24   [provider]  doxycycline  (VIBRAMYCIN ) 100 MG capsule Take 1 capsule (100 mg total) by mouth 2 (two) times daily. One po bid x 7 days 11/28/24   Suzette Pac, MD  ibuprofen  (ADVIL ) 800 MG tablet Take 1 tablet (800 mg total) by mouth 3 (three) times daily. Take with food 11/27/24   Triplett, Tammy, PA-C  insulin  glargine (LANTUS ) 100 UNIT/ML Solostar Pen Inject 24 Units into the skin daily. 09/05/24   Ricky Fines, MD  insulin  lispro (HUMALOG ) 100 UNIT/ML KwikPen Inject 7 Units into the skin 3 (three) times daily. 09/05/24   Ricky Fines, MD  Insulin  Pen Needle (ADVOCATE  INSULIN  PEN NEEDLES) 31G X 8 MM MISC Use daily to inject insulin  as prescribed. 09/05/24   Ricky Fines, MD  metformin  (FORTAMET ) 500 MG (OSM) 24 hr tablet Take 1 tablet (500 mg total) by mouth 2 (two) times daily with a meal. 09/05/24   Ricky Fines, MD  methocarbamol  (ROBAXIN ) 500 MG tablet Take 1 tablet (500 mg total) by mouth 3 (three) times daily. 11/27/24   Triplett, Tammy, PA-C  oxyCODONE -acetaminophen  (PERCOCET/ROXICET) 5-325 MG tablet Take 1 tablet by mouth every 6 (six) hours as needed for severe pain (pain score 7-10). 11/28/24   Zammit, Joseph, MD  sertraline (ZOLOFT) 25 MG tablet Take 25 mg by mouth daily. 10/31/24   [provider]    Allergies: Patient has no active allergies.    Review of Systems  All other systems reviewed and are negative.   Updated Vital Signs BP (!) 140/70 (BP Location: Left Arm)   Pulse 90   Temp 98.2 F (36.8 C) (Oral)   Resp 18   Ht 6' 2 (1.88 m)   Wt 105.1 kg   SpO2 99%   BMI 29.75 kg/m   Physical Exam Vitals and nursing note reviewed.  Constitutional:      General: He is not in acute distress.    Appearance: He is well-developed.  HENT:     Head: Normocephalic and atraumatic.  Eyes:     Conjunctiva/sclera: Conjunctivae normal.  Cardiovascular:  Rate and Rhythm: Normal rate and regular rhythm.     Heart sounds: No murmur heard.    Comments: Right arm PICC line appears unremarkable. Assess flush and pull and no concerns here in the ER. Pulmonary:     Effort: Pulmonary effort is normal. No respiratory distress.     Breath sounds: Normal breath sounds.  Abdominal:     Palpations: Abdomen is soft.     Tenderness: There is no abdominal tenderness.  Musculoskeletal:        General: No swelling.     Cervical back: Neck supple.  Skin:    General: Skin is warm and dry.     Capillary Refill: Capillary refill takes less than 2 seconds.  Neurological:     Mental Status: He is alert.  Psychiatric:        Mood and Affect:  Mood normal.     (all labs ordered are listed, but only abnormal results are displayed) Labs Reviewed - No data to display  EKG: None  Radiology: No results found.   Procedures   Medications Ordered in the ED  heparin lock flush 100 UNIT/ML injection (500 Units  Given 12/24/24 1536)                                    Medical Decision Making  This patient presents to the ED for concern of PICC line concerns. Differential diagnosis includes occluded PICC line, infection of PICC site, cellulitis, edema, medical equipment change    Additional history obtained:  Additional history obtained from chart review   Problem List / ED Course:  Patient presents to the ED with PICC line concerns. States that he has a PICC line to treat septic arthritis of his neck and is to continue antibiotics for about 2-3 more weeks. States that he has been unable to get supplies changes but PICC line is still functioning well. He states the connector and dressing needs to be changed. PICC line site appears to be unremarkable with no signs of redness, drainage, or swelling. Had paramedic replace tubing and dressing over the site in sterile fashion. PICC line was tested and found to flush without any concerns. Advised patient to have home health nurse perform future maintenance as needed or to return to the ER if this cannot be done. He is otherwise stable for outpatient follow up and discharged home.   Social Determinants of Health:  None  Final diagnoses:  PICC (peripherally inserted central catheter) in place    ED Discharge Orders     None          John Church 12/24/24 1630    John Lamar BROCKS, MD 12/24/24 2025  "

## 2024-12-24 NOTE — Discharge Instructions (Signed)
 You were seen in the ER today for PICC line evaluation. The tubing and dressing was replaced today and the line is still flushing well. Please continue to take your antibiotics as prescribed. Follow up closely with your home health nurse for further maintenance of this line. For any concerns of occlusion in the line, or infection at the PICC line site, seek medical evaluation.

## 2024-12-24 NOTE — ED Triage Notes (Signed)
 Pt arrived POV requesting his PICC line to be changed. Pt is having to have IV medication bid and [place]  [employer] requesting someone to change it for them due to holidays. Pt got his PICC line in [place] .

## 2025-01-06 ENCOUNTER — Telehealth: Payer: Self-pay

## 2025-01-06 ENCOUNTER — Encounter: Payer: Self-pay | Admitting: Internal Medicine

## 2025-01-06 ENCOUNTER — Ambulatory Visit: Payer: Self-pay | Admitting: Internal Medicine

## 2025-01-06 ENCOUNTER — Other Ambulatory Visit: Payer: Self-pay

## 2025-01-06 VITALS — BP 153/104 | HR 104 | Temp 97.9°F | Ht 74.0 in | Wt 213.0 lb

## 2025-01-06 DIAGNOSIS — I330 Acute and subacute infective endocarditis: Secondary | ICD-10-CM

## 2025-01-06 DIAGNOSIS — B951 Streptococcus, group B, as the cause of diseases classified elsewhere: Secondary | ICD-10-CM

## 2025-01-06 DIAGNOSIS — M4650 Other infective spondylopathies, site unspecified: Secondary | ICD-10-CM

## 2025-01-06 DIAGNOSIS — R7881 Bacteremia: Secondary | ICD-10-CM

## 2025-01-06 MED ORDER — AMOXICILLIN 500 MG PO CAPS: 1000.0000 mg | ORAL_CAPSULE | Freq: Three times a day (TID) | ORAL | 0 refills | Status: AC

## 2025-01-06 NOTE — Telephone Encounter (Signed)
 Patient is using [facility] for infusion pharmacy and [facility].   Left message with [facility] pharmacist requesting call back to have labs faxed to triage.   ROI for Karius report faxed to [facility] ID:   [facility] [place]  [facility], [address] [place], [place]  [address] P: [phone] Fax: [phone]  Duwaine Lowe, BSN, RN

## 2025-01-06 NOTE — Telephone Encounter (Signed)
 Left voicemail for [facility] with [facility] requesting patient's labs be faxed to triage. Left provider line number for any additional questions or concerns.   Jahquez Steffler, BSN, RN

## 2025-01-06 NOTE — Progress Notes (Signed)
 "     Patient: John Church  DOB: 09/04/1976 MRN: [id number] PCP: Jolee Elsie GORMAN, PA      Patient Active Problem List   Diagnosis Date Noted   Uncontrolled type 2 diabetes mellitus with hyperglycemia, without long-term current use of insulin  (HCC) 09/05/2024   Diabetic foot ulcer (HCC) 09/04/2024   Diabetic foot infection (HCC) 09/01/2024   Toe osteomyelitis, left (HCC) 01/18/2022   DM (diabetes mellitus) (HCC) 01/18/2022   Amputated toe of left foot 01/18/2022   Osteomyelitis of great toe of left foot (HCC)      Subjective:  John Church is a 49 y.o. male with past medical history of diabetes, kidney stones, COVID-19 presents for management of septic arthritis.  Patient was initially seen at [facility] ED for complaint of upper back and neck pain on 11/28/2024.  CT cervical spine had shown patchy airspace opacity in right lung apex discharged on doxycycline .  He was then travel to [place]  [facility] presented with worsening symptoms of weakness, uncontrollable hiccups, decreased bowel movements neck pains and stiffness.  He had a fall in the shower that morning on 12/03/2024.  He was confused and dizzy as well.  Found to have electrolyte abnormalities admitted to ICU for DKA.  Started on broad-spectrum antibiotics with vancomycin  and pip-tazo.  ID was consulted given blood cultures from admission on 12/10 grew 2 out of 2 sets strep.  MRI T-spine showed septic arthritis at atlantoaxial.  Marrow signal abnormality dens and lateral mass of C1 could be osteomyelitis, cervical prevertebral soft tissue swelling with microabscesses C1-C2.  TTE did not show obvious echo.  MRI left foot did not show osteomyelitis/abscess.  NSGY was consulted no surgical intervention planned.  IR was consulted, no safe window for aspiration of prevertebral space.  He was discharged on Dapto and ertapenem x 6 weeks from negative blood cultures EOT 01/14/2025.  ID had recommended TEE outpatient.  Review of Systems  All  other systems reviewed and are negative.   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 (Patient not taking: Reported on 11/28/2024) 6 each 1   cyclobenzaprine (FLEXERIL) 5 MG tablet Take 5 mg by mouth at bedtime as needed. (Patient not taking: Reported on 11/28/2024)     doxycycline  (VIBRAMYCIN ) 100 MG capsule Take 1 capsule (100 mg total) by mouth 2 (two) times daily. One po bid x 7 days 14 capsule 0   ibuprofen  (ADVIL ) 800 MG tablet Take 1 tablet (800 mg total) by mouth 3 (three) times daily. Take with food 15 tablet 0   insulin  glargine (LANTUS ) 100 UNIT/ML Solostar Pen Inject 24 Units into the skin daily. 15 mL 5   insulin  lispro (HUMALOG ) 100 UNIT/ML KwikPen Inject 7 Units into the skin 3 (three) times daily. 15 mL 5   Insulin  Pen Needle (ADVOCATE INSULIN  PEN NEEDLES) 31G X 8 MM MISC Use daily to inject insulin  as prescribed. 100 each 2   metformin  (FORTAMET ) 500 MG (OSM) 24 hr tablet Take 1 tablet (500 mg total) by mouth 2 (two) times daily with a meal. 60 tablet  2   methocarbamol  (ROBAXIN ) 500 MG tablet Take 1 tablet (500 mg total) by mouth 3 (three) times daily. 21 tablet 0   oxyCODONE -acetaminophen  (PERCOCET/ROXICET) 5-325 MG tablet Take 1 tablet by mouth every 6 (six) hours as needed for severe pain (pain score 7-10). 20 tablet 0   sertraline (ZOLOFT) 25 MG tablet Take 25 mg by mouth daily.     No facility-administered medications prior to visit.     Allergies[1]  Social History[2]  Family History  Problem Relation Age of Onset   Diabetes Mother    Diabetes Father    Diabetes Brother     Objective:  There were  no vitals filed for this visit. There is no height or weight on file to calculate BMI.  Physical Exam Constitutional:      General: He is not in acute distress.    Appearance: He is normal weight. He is not toxic-appearing.  HENT:     Head: Normocephalic and atraumatic.     Right Ear: External ear normal.     Left Ear: External ear normal.     Nose: No congestion or rhinorrhea.     Mouth/Throat:     Mouth: Mucous membranes are moist.     Pharynx: Oropharynx is clear.  Eyes:     Extraocular Movements: Extraocular movements intact.     Conjunctiva/sclera: Conjunctivae normal.     Pupils: Pupils are equal, round, and reactive to light.  Cardiovascular:     Rate and Rhythm: Normal rate and regular rhythm.     Heart sounds: No murmur heard.    No friction rub. No gallop.  Pulmonary:     Effort: Pulmonary effort is normal.     Breath sounds: Normal breath sounds.  Abdominal:     General: Abdomen is flat. Bowel sounds are normal.     Palpations: Abdomen is soft.  Musculoskeletal:     Cervical back: Normal range of motion and neck supple.  Skin:    General: Skin is warm and dry.  Neurological:     General: No focal deficit present.     Mental Status: He is oriented to person, place, and time.  Psychiatric:        Mood and Affect: Mood normal.     Lab Results: Lab Results  Component Value Date   WBC 15.0 (H) 11/28/2024   HGB 16.3 11/28/2024   HCT 47.2 11/28/2024   MCV 84.1 11/28/2024   PLT 320 11/28/2024    Lab Results  Component Value Date   CREATININE 0.87 11/28/2024   BUN 12 11/28/2024   NA 132 (L) 11/28/2024   K 3.8 11/28/2024   CL 94 (L) 11/28/2024   CO2 26 11/28/2024    Lab Results  Component Value Date   ALT 14 11/28/2024   AST 15 11/28/2024   ALKPHOS 115 11/28/2024   BILITOT 1.3 (H) 11/28/2024     Assessment & Plan:  #GBS bacteremia with cervical infection #PICC #Medication Management -On 12/5 pt presented to  ED with Neck pain ct  cervical spine, rul opacityr-> doxy. Then traveled to [facility] on 12/6 where he fell in the shower on 12/10 and had weakness/confusion. [facility]. Found to have GBS bacteremia and cervial spine infection.  Blood cx:12/10 blood Cx 1/2 GBS. 12/16 ng.  ID was consulted given blood cultures from admission on 12/10 grew 2 out of 2 sets GBS.  MRI T-spine showed septic arthritis at atlantoaxial.  Marrow signal  abnormality dens and lateral mass of C1 could be osteomyelitis, cervical prevertebral soft tissue swelling with microabscesses C1-C2.  TTE did not show obvious echo.  MRI left foot did not show osteomyelitis/abscess.  NSGY was consulted no surgical intervention planned.  IR was consulted, no safe window for aspiration of prevertebral space.  He was discharged on Dapto and ertapenem x 6 weeks from negative blood cultures EOT 01/14/2025. ID had recc tee outpatient, Karius done.  -In October 2025 pt had gone to [employer], walked around. Had a blister reuiring wound care left  foot ball.  Neck pain resolved. Refer to neurosrugery. Neck brace on Plan -Will get labs(HH) to change dressing, labs-> yesterday -01/14/25-> After review of labs will give final EOT  If picc removed on 01/14/25 then start oral amoxicillin  1gm tid x 2 weeks on 01/15/25 F/U with ID in one month Neurosurgery referral Cardiology referral for TEE per recc  ID at outside OSH. F/U on karius test(we do not see the results in the system).  Loney Stank, MD [facility]   01/06/2025  10:01 AM I personally spent a total of 75 minutes in the care of the patient today including preparing to see the patient, getting/reviewing separately obtained history, performing a medically appropriate exam/evaluation, counseling and educating, placing orders, documenting clinical information in the EHR, independently interpreting results, and communicating results.     [1] No  Active Allergies [2]  Social History Tobacco Use   Smoking status: Never    Passive exposure: Never   Smokeless tobacco: Never  Vaping Use   Vaping status: Never Used  Substance Use Topics   Alcohol use: Not Currently   Drug use: Never   "

## 2025-01-06 NOTE — Patient Instructions (Addendum)
 After review of labs will give final EOT  If picc removed on 01/14/25 then start oral amoxicillin  1gm tid x 2 weeks on 01/15/25 F/U with ID in one month Neurosurgery referral Cardiology referral for TEE

## 2025-01-08 NOTE — Telephone Encounter (Signed)
 Called [facility] again, spoke with [facility]. He confirms they have labs from 1/13. Asked that he fax those and all future labs to triage.   Shekinah Pitones, BSN, RN

## 2025-01-12 NOTE — H&P (View-Only) (Signed)
 "     Cardiology Office Note Date:  01/13/2025  ID:  John Church, DOB 1976/01/03, MRN [id number] PCP:  Jolee Elsie GORMAN, PA  Cardiologist:  Joelle VEAR Ren Donley, MD  Chief Complaint  Patient presents with   bacteremia     Problems GBS bacteremia (12/25) IDDM c/b DKA and DFU (HA1C 12.3 9/25) M: Insulin   Visits  1/26: TTE, TEE per ID, Endo referral, ASA, RN20, BP cuff; genetic testing/LP/CTCA during next visit     Discussed the use of AI scribe software for clinical note transcription with the patient, who gave verbal consent to proceed.  History of Present Illness John Church is a 49 year old male who presents for cardiovascular evaluation and management. He was referred by the infectious disease team for cardiovascular evaluation and management. He was admitted to the hospital on December 10th for a blood infection and is currently receiving antibiotics through a PICC line. A transesophageal echocardiogram (TEE) was planned during his hospital stay but was not performed. He was referred for further evaluation and management, including a transthoracic echocardiogram (TTE). He has a history of type 2 diabetes for over 20 years, initially untreated due to lack of medical insurance. Since October, he has been on insulin  following a regimen change from oral medications to metformin  and insulin . His insulin  regimen was adjusted during a recent hospital stay in [place] , switching from insulin  pens to vials. He denies any known history of heart problems but experiences shortness of breath with physical activity, which he attributes to being 'not very physically active.' No chest pain unless exposed to cold air, which causes coughing and subsequent discomfort. There is a significant family history of heart disease, with both grandparents having had heart disease and passing in their late eighties. The youngest family member to have a heart attack was around 49 years old. No history of smoking, except  for a single cigarette in college over 28 years ago.   ROS: Please see the history of present illness. All other systems are reviewed and negative.   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 84 capsule 0   Continuous Glucose Sensor (FREESTYLE LIBRE 3 PLUS SENSOR) MISC Change sensor every 15 days. 6 each 1   insulin  glargine (LANTUS ) 100 UNIT/ML Solostar Pen Inject 24 Units into the skin daily. 15 mL 5   Insulin  Pen Needle (ADVOCATE INSULIN  PEN NEEDLES) 31G X 8 MM MISC Use daily to inject insulin  as prescribed. 100 each 2   metformin  (FORTAMET ) 500 MG (OSM) 24 hr tablet Take 1 tablet (500 mg total) by mouth 2 (two) times daily with a meal. 60 tablet 2   sertraline (ZOLOFT) 25 MG tablet Take 25 mg by mouth daily.     ibuprofen  (ADVIL ) 800 MG tablet Take 1  tablet (800 mg total) by mouth 3 (three) times daily. Take with food (Patient not taking: Reported on 01/13/2025) 15 tablet 0   insulin  lispro (HUMALOG ) 100 UNIT/ML KwikPen Inject 7 Units into the skin 3 (three) times daily. (Patient not taking: Reported on 01/13/2025) 15 mL 5   No current facility-administered medications for this visit.    Allergies:   Patient has no known allergies.   Social History:  see above  Family History:  see above  PHYSICAL EXAM: VS:  BP (!) 158/95 (BP Location: Left Arm, Patient Position:  Sitting)   Pulse 99   Ht 6' 2 (1.88 m)   Wt 216 lb (98 kg)   SpO2 99%   BMI 27.73 kg/m  , BMI Body mass index is 27.73 kg/m. GEN: Well nourished, well developed, in no acute distress HEENT: normal Neck: no JVD, carotid bruits, or masses Cardiac: RRR; no murmurs, rubs, or gallops,no edema  Respiratory:  CTAB bilaterally, normal work of breathing GI: soft, nontender, nondistended, + BS Extremities: No LE edema Skin: warm and dry, no rash Neuro:  Strength and sensation are intact  EKG: RBBB  Recent Labs: Reviewed  Studies: Reviewed  Assessment and Plan Assessment & Plan Bacteremia due to group B Streptococcus Recent hospitalization for bacteremia. Receiving antibiotics via PICC line. TEE recommended to assess cardiac involvement. - Schedule TEE to assess for cardiac involvement. - Order TTE to evaluate heart function.  Type 2 diabetes mellitus with circulatory complications Long-standing diabetes managed with insulin  and metformin . High cardiovascular risk due to inconsistent management and medication changes. - Referred to diabetes specialist for comprehensive management. - Initiated aspirin  therapy to reduce cardiovascular risk. - Initiated cholesterol medication to manage cardiovascular risk.  Cardiovascular risk assessment and prevention High cardiovascular risk due to diabetes and family history. Elevated blood pressure noted. Shortness of breath likely related to deconditioning and diabetes. - Provided blood pressure cuff and log for home monitoring. - Instructed on blood pressure monitoring technique. - Scheduled follow-up in six weeks for cardiovascular risk assessment and prevention discussion.   Signed, Joelle VEAR Ren Donley, MD  01/13/2025 8:32 AM    [facility] "

## 2025-01-12 NOTE — Progress Notes (Unsigned)
 "     Cardiology Office Note Date:  01/13/2025  ID:  John Church, DOB 1976/01/03, MRN [id number] PCP:  John Elsie GORMAN, PA  Cardiologist:  John VEAR Ren Donley, MD  Chief Complaint  Patient presents with   bacteremia     Problems GBS bacteremia (12/25) IDDM c/b DKA and DFU (HA1C 12.3 9/25) M: Insulin   Visits  1/26: TTE, TEE per ID, Endo referral, ASA, RN20, BP cuff; genetic testing/LP/CTCA during next visit     Discussed the use of AI scribe software for clinical note transcription with the patient, who gave verbal consent to proceed.  History of Present Illness John Church is a 49 year old male who presents for cardiovascular evaluation and management. He was referred by the infectious disease team for cardiovascular evaluation and management. He was admitted to the hospital on December 10th for a blood infection and is currently receiving antibiotics through a PICC line. A transesophageal echocardiogram (TEE) was planned during his hospital stay but was not performed. He was referred for further evaluation and management, including a transthoracic echocardiogram (TTE). He has a history of type 2 diabetes for over 20 years, initially untreated due to lack of medical insurance. Since October, he has been on insulin  following a regimen change from oral medications to metformin  and insulin . His insulin  regimen was adjusted during a recent hospital stay in [place] , switching from insulin  pens to vials. He denies any known history of heart problems but experiences shortness of breath with physical activity, which he attributes to being 'not very physically active.' No chest pain unless exposed to cold air, which causes coughing and subsequent discomfort. There is a significant family history of heart disease, with both grandparents having had heart disease and passing in their late eighties. The youngest family member to have a heart attack was around 49 years old. No history of smoking, except  for a single cigarette in college over 28 years ago.   ROS: Please see the history of present illness. All other systems are reviewed and negative.   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 84 capsule 0   Continuous Glucose Sensor (FREESTYLE LIBRE 3 PLUS SENSOR) MISC Change sensor every 15 days. 6 each 1   insulin  glargine (LANTUS ) 100 UNIT/ML Solostar Pen Inject 24 Units into the skin daily. 15 mL 5   Insulin  Pen Needle (ADVOCATE INSULIN  PEN NEEDLES) 31G X 8 MM MISC Use daily to inject insulin  as prescribed. 100 each 2   metformin  (FORTAMET ) 500 MG (OSM) 24 hr tablet Take 1 tablet (500 mg total) by mouth 2 (two) times daily with a meal. 60 tablet 2   sertraline (ZOLOFT) 25 MG tablet Take 25 mg by mouth daily.     ibuprofen  (ADVIL ) 800 MG tablet Take 1  tablet (800 mg total) by mouth 3 (three) times daily. Take with food (Patient not taking: Reported on 01/13/2025) 15 tablet 0   insulin  lispro (HUMALOG ) 100 UNIT/ML KwikPen Inject 7 Units into the skin 3 (three) times daily. (Patient not taking: Reported on 01/13/2025) 15 mL 5   No current facility-administered medications for this visit.    Allergies:   Patient has no known allergies.   Social History:  see above  Family History:  see above  PHYSICAL EXAM: VS:  BP (!) 158/95 (BP Location: Left Arm, Patient Position:  Sitting)   Pulse 99   Ht 6' 2 (1.88 m)   Wt 216 lb (98 kg)   SpO2 99%   BMI 27.73 kg/m  , BMI Body mass index is 27.73 kg/m. GEN: Well nourished, well developed, in no acute distress HEENT: normal Neck: no JVD, carotid bruits, or masses Cardiac: RRR; no murmurs, rubs, or gallops,no edema  Respiratory:  CTAB bilaterally, normal work of breathing GI: soft, nontender, nondistended, + BS Extremities: No LE edema Skin: warm and dry, no rash Neuro:  Strength and sensation are intact  EKG: RBBB  Recent Labs: Reviewed  Studies: Reviewed  Assessment and Plan Assessment & Plan Bacteremia due to group B Streptococcus Recent hospitalization for bacteremia. Receiving antibiotics via PICC line. TEE recommended to assess cardiac involvement. - Schedule TEE to assess for cardiac involvement. - Order TTE to evaluate heart function.  Type 2 diabetes mellitus with circulatory complications Long-standing diabetes managed with insulin  and metformin . High cardiovascular risk due to inconsistent management and medication changes. - Referred to diabetes specialist for comprehensive management. - Initiated aspirin  therapy to reduce cardiovascular risk. - Initiated cholesterol medication to manage cardiovascular risk.  Cardiovascular risk assessment and prevention High cardiovascular risk due to diabetes and family history. Elevated blood pressure noted. Shortness of breath likely related to deconditioning and diabetes. - Provided blood pressure cuff and log for home monitoring. - Instructed on blood pressure monitoring technique. - Scheduled follow-up in six weeks for cardiovascular risk assessment and prevention discussion.   Signed, John VEAR Ren Donley, MD  01/13/2025 8:32 AM    [facility] "

## 2025-01-13 ENCOUNTER — Ambulatory Visit

## 2025-01-13 ENCOUNTER — Telehealth: Payer: Self-pay

## 2025-01-13 ENCOUNTER — Other Ambulatory Visit (HOSPITAL_COMMUNITY): Payer: Self-pay

## 2025-01-13 VITALS — BP 158/95 | HR 99 | Ht 74.0 in | Wt 216.0 lb

## 2025-01-13 DIAGNOSIS — B951 Streptococcus, group B, as the cause of diseases classified elsewhere: Secondary | ICD-10-CM | POA: Diagnosis not present

## 2025-01-13 DIAGNOSIS — R7881 Bacteremia: Secondary | ICD-10-CM

## 2025-01-13 DIAGNOSIS — E1159 Type 2 diabetes mellitus with other circulatory complications: Secondary | ICD-10-CM | POA: Diagnosis not present

## 2025-01-13 DIAGNOSIS — Z794 Long term (current) use of insulin: Secondary | ICD-10-CM

## 2025-01-13 DIAGNOSIS — M869 Osteomyelitis, unspecified: Secondary | ICD-10-CM

## 2025-01-13 MED ORDER — ASPIRIN 81 MG PO TBEC: 81.0000 mg | DELAYED_RELEASE_TABLET | Freq: Every day | ORAL | 3 refills | Status: AC

## 2025-01-13 MED ORDER — OMRON 3 SERIES BP MONITOR DEVI: 0 refills | Status: DC

## 2025-01-13 MED ORDER — ROSUVASTATIN CALCIUM 20 MG PO TABS: 20.0000 mg | ORAL_TABLET | Freq: Every day | ORAL | 3 refills | Status: AC

## 2025-01-13 NOTE — Telephone Encounter (Signed)
 Sorry - missed this message somehow. No, we do not have any labs on him unfortunately.

## 2025-01-13 NOTE — Patient Instructions (Addendum)
 Medication Instructions:  Start Aspirin  81 mg take one tablet daily  Start Crestor  20 mg take one tablet daily *If you need a refill on your cardiac medications before your next appointment, please call your pharmacy*   Testing/Procedures: Your physician has requested that you have a TEE on 01/26/2025 Arrive at 9am. During a TEE, sound waves are used to create images of your heart. It provides your doctor with information about the size and shape of your heart and how well your hearts chambers and valves are working. In this test, a transducer is attached to the end of a flexible tube thats guided down your throat and into your esophagus (the tube leading from you mouth to your stomach) to get a more detailed image of your heart. You are not awake for the procedure. Please see the instruction sheet given to you today. For further information please visit [email or web address]     Your physician has requested that you have an echocardiogram. Echocardiography is a painless test that uses sound waves to create images of your heart. It provides your doctor with information about the size and shape of your heart and how well your hearts chambers and valves are working. This procedure takes approximately one hour. There are no restrictions for this procedure. Please do NOT wear cologne, perfume, aftershave, or lotions (deodorant is allowed). Please arrive 15 minutes prior to your appointment time.  Please note: We ask at that you not bring children with you during ultrasound (echo/ vascular) testing. Due to room size and safety concerns, children are not allowed in the ultrasound rooms during exams. Our front office staff cannot provide observation of children in our lobby area while testing is being conducted. An adult accompanying a patient to their appointment will only be allowed in the ultrasound room at the discretion of the ultrasound technician under special circumstances. We apologize for any  inconvenience.   Follow-Up: At [facility], you and your health needs are our priority.  As part of our continuing mission to provide you with exceptional heart care, our providers are all part of one team.  This team includes your primary Cardiologist (physician) and Advanced Practice Providers or APPs (Physician Assistants and Nurse Practitioners) who all work together to provide you with the care you need, when you need it.  Your next appointment:   6 week(s)  Provider:   Joelle VEAR Ren Donley, MD    We recommend signing up for the patient portal called MyChart.  Sign up information is provided on this After Visit Summary.  MyChart is used to connect with patients for Virtual Visits (Telemedicine).  Patients are able to view lab/test results, encounter notes, upcoming appointments, etc.  Non-urgent messages can be sent to your provider as well.   To learn more about what you can do with MyChart, go to [email or web address].   Other Instructions  Referral sent to Endocrinology      Monitor your blood pressure and bring readings to your next appointment.  We need to get a better idea of what your blood pressure is running at home. Here are some instructions to follow: - I would recommend using a blood pressure cuff that goes on your arm. The wrist ones can be inaccurate. If you're purchasing one for the first time, try to select one that also reports your heart rate because this can be helpful information as well. - To check your blood pressure, choose a time at least 3 hours after taking your blood  pressure medicines. If you can sample it at different times of the day, that's great - it might give you more information about how your blood pressure fluctuates. Remain seated in a chair for 5 minutes quietly beforehand, then check it.  - Please record a list of those readings and call us /send in [employer].

## 2025-01-13 NOTE — Telephone Encounter (Signed)
 Received call from [place]  [facility], they do not have the [place] report that was requested. ROI will need to be sent to [facility]:  P: [phone] F: [phone]  ROI re-faxed to Medical Records department of [facility].   Laddie Math, BSN, RN

## 2025-01-13 NOTE — Telephone Encounter (Signed)
 John Church with [facility] again, he sent labs via encrypted email. Results printed and given to pharmacy team.   John Church, BSN, RN

## 2025-01-14 NOTE — Telephone Encounter (Signed)
 Labs from 01/06/25 - WBC 8.3, SCr 0.71, ESR 53, CRP 32

## 2025-01-16 NOTE — Telephone Encounter (Signed)
 Stop IV abx, pull picc->then start oral amoxicillin  1gm tid x 2 weeks  He has f/u wit me on 2/6

## 2025-01-19 NOTE — Telephone Encounter (Signed)
 Megan - can you help me with this tomorrow? Thank you!

## 2025-01-19 NOTE — Telephone Encounter (Signed)
 Patient had previously stated [facility] was [facility] agency, called [facility], they do not have him listed as a patient.   Called [facility] to relay pull PICC orders, no answer. Left voicemail requesting call back to relay orders and get additional information regarding HH.   Called [facility] to update him on plan of care, no answer. Left HIPAA compliant voicemail stating MyChart message would be sent and to call with any questions.   Andriana Casa, BSN, RN

## 2025-01-20 NOTE — Telephone Encounter (Signed)
 Attempted to call [facility] x 2 this morning, no answer.   Lorry Anastasi, BSN, RN

## 2025-01-20 NOTE — Telephone Encounter (Signed)
 Received voicemail from [facility] stating patient's [facility] agency is [facility] and that pull PICC order would need to be communicated to them directly.   Called [facility], gave verbal order to pull PICC line per Dr. Dennise to [facility].   [facility] [phone]  Mattia Osterman, BSN, RN

## 2025-01-21 ENCOUNTER — Telehealth: Payer: Self-pay

## 2025-01-21 DIAGNOSIS — M869 Osteomyelitis, unspecified: Secondary | ICD-10-CM

## 2025-01-21 DIAGNOSIS — E1159 Type 2 diabetes mellitus with other circulatory complications: Secondary | ICD-10-CM

## 2025-01-21 DIAGNOSIS — R7881 Bacteremia: Secondary | ICD-10-CM

## 2025-01-21 MED ORDER — OMRON 3 SERIES BP MONITOR DEVI: 0 refills | Status: AC

## 2025-01-21 NOTE — Addendum Note (Signed)
 Addended by: JANIT GENI CROME on: 01/21/2025 12:36 PM   Modules accepted: Orders

## 2025-01-21 NOTE — Telephone Encounter (Signed)
 Spoke with pt regarding TEE instructions. Pt verbalized understanding. Pt was advised to get labs drawn at his earliest convenience. Pt agreed. Pt was advised to call our office if he had any questions.

## 2025-01-23 LAB — CBC
Hematocrit: 46.7 % (ref 37.5–51.0)
Hemoglobin: 15.5 g/dL (ref 13.0–17.7)
MCH: 28.3 pg (ref 26.6–33.0)
MCHC: 33.2 g/dL (ref 31.5–35.7)
MCV: 85 fL (ref 79–97)
Platelets: 245 10*3/uL (ref 150–450)
RBC: 5.47 x10E6/uL (ref 4.14–5.80)
RDW: 13.6 % (ref 11.6–15.4)
WBC: 9.2 10*3/uL (ref 3.4–10.8)

## 2025-01-23 LAB — BASIC METABOLIC PANEL WITH GFR
BUN/Creatinine Ratio: 13 (ref 9–20)
BUN: 11 mg/dL (ref 6–24)
CO2: 24 mmol/L (ref 20–29)
Calcium: 10 mg/dL (ref 8.7–10.2)
Chloride: 94 mmol/L — ABNORMAL LOW (ref 96–106)
Creatinine, Ser: 0.87 mg/dL (ref 0.76–1.27)
Glucose: 339 mg/dL — ABNORMAL HIGH (ref 70–99)
Potassium: 4.3 mmol/L (ref 3.5–5.2)
Sodium: 135 mmol/L (ref 134–144)
eGFR: 106 mL/min/{1.73_m2}

## 2025-01-26 ENCOUNTER — Encounter (HOSPITAL_COMMUNITY): Payer: Self-pay

## 2025-01-26 ENCOUNTER — Ambulatory Visit (HOSPITAL_COMMUNITY): Admitting: Anesthesiology

## 2025-01-26 ENCOUNTER — Ambulatory Visit (HOSPITAL_COMMUNITY): Admission: RE | Admit: 2025-01-26 | Discharge: 2025-01-26 | Disposition: A

## 2025-01-26 ENCOUNTER — Ambulatory Visit (HOSPITAL_COMMUNITY): Admission: RE | Admit: 2025-01-26 | Discharge: 2025-01-26 | Disposition: A | Source: Ambulatory Visit

## 2025-01-26 ENCOUNTER — Other Ambulatory Visit: Payer: Self-pay

## 2025-01-26 ENCOUNTER — Encounter (HOSPITAL_COMMUNITY): Admission: RE | Disposition: A | Discharge status: Home / Self Care | Payer: Self-pay | Source: Home / Self Care

## 2025-01-26 DIAGNOSIS — R7881 Bacteremia: Secondary | ICD-10-CM | POA: Insufficient documentation

## 2025-01-26 DIAGNOSIS — M869 Osteomyelitis, unspecified: Secondary | ICD-10-CM

## 2025-01-26 DIAGNOSIS — Z7984 Long term (current) use of oral hypoglycemic drugs: Secondary | ICD-10-CM | POA: Insufficient documentation

## 2025-01-26 DIAGNOSIS — Z8249 Family history of ischemic heart disease and other diseases of the circulatory system: Secondary | ICD-10-CM | POA: Insufficient documentation

## 2025-01-26 DIAGNOSIS — E1159 Type 2 diabetes mellitus with other circulatory complications: Secondary | ICD-10-CM | POA: Insufficient documentation

## 2025-01-26 DIAGNOSIS — B951 Streptococcus, group B, as the cause of diseases classified elsewhere: Secondary | ICD-10-CM | POA: Insufficient documentation

## 2025-01-26 DIAGNOSIS — Z794 Long term (current) use of insulin: Secondary | ICD-10-CM | POA: Insufficient documentation

## 2025-01-26 DIAGNOSIS — E1142 Type 2 diabetes mellitus with diabetic polyneuropathy: Secondary | ICD-10-CM | POA: Insufficient documentation

## 2025-01-26 LAB — GLUCOSE, CAPILLARY: Glucose-Capillary: 302 mg/dL — ABNORMAL HIGH (ref 70–99)

## 2025-01-26 LAB — ECHO TEE

## 2025-01-26 MED ORDER — PROPOFOL 10 MG/ML IV BOLUS
INTRAVENOUS | Status: DC | PRN
  Administered 2025-01-26: 30 mg via INTRAVENOUS
  Administered 2025-01-26: 50 mg via INTRAVENOUS
  Administered 2025-01-26: 30 mg via INTRAVENOUS
  Administered 2025-01-26: 100 ug/kg/min via INTRAVENOUS

## 2025-01-26 MED ORDER — SODIUM CHLORIDE 0.9 % IV SOLN: INTRAVENOUS | Status: DC

## 2025-01-26 NOTE — Discharge Instructions (Signed)
 Transesophageal Echocardiogram  Transesophageal echocardiogram, or TEE, is a test that uses sound waves to make pictures of your heart. TEE is done using a small ultrasound probe. The probe is passed down your esophagus, which is the part of your body that moves food from your mouth to your stomach. Because your heart is near your esophagus, the TEE will give clear pictures of your heart. Your health care provider can use a TEE: To see how different parts of your heart are working. To check for problems with your heart, such as infection, blood clots, or growths. You may feel the probe in your throat, but the test usually doesn't cause pain or affect your breathing. Tell a health care provider about: Any allergies you have. All medicines you are taking. These include vitamins, herbs, eye drops, creams, and over-the-counter medicines. Any problems you or family members have had with anesthesia. Any bleeding problems you have. Any surgeries you have had. Any medical conditions you have. Any trouble with swallowing. Whether you have or have had a blockage of the esophagus. Whether you're pregnant or may be pregnant. What are the risks? Your provider will talk with you about risks. These may include: Damage to nearby structures or organs. A tear of the esophagus. Fast or uneven heartbeats. A hoarse voice or trouble swallowing. Bleeding. What happens before the procedure? Medicines Ask about changing or stopping: Any medicines you take. Any vitamins, herbs, or supplements you take. Do not take aspirin or ibuprofen unless you're told to. General instructions Follow instructions about what you may eat and drink. You will need to take out any dentures or dental retainers. Ask if you'll be staying overnight in the hospital. If you'll be going home right after the test, plan to have a responsible adult: Drive you home from the hospital or clinic. You won't be allowed to drive. Stay with you  for the time you are told. What happens during the procedure?  An IV will be put into a vein in your hand or arm. You will be given: A sedative. This helps you relax. Anesthesia. This keeps you from feeling pain. It will be sprayed, or you'll gargle it, to numb the back of your throat. You may be asked to lie on your left side. A bite block will be put in your mouth. This keeps you from biting the probe. The tip of the probe will be placed into the back of your mouth. You'll be asked to swallow. Once the probe is in place, your provider will take pictures of your heart. The probe and bite block will be taken out after the test is done. The procedure may vary among providers and hospitals.  What can I expect after the procedure? You will be watched closely until you leave. This includes checking your blood pressure, heart rate, breathing rate, and blood oxygen level. Your throat may feel numb or sore. This will get better over time. You will not be allowed to eat or drink until the numbness has gone away. Ask when your test results will be ready and how to get them. You may need to call or meet with your provider to discuss your results.  This information is not intended to replace advice given to you by your health care provider. Make sure you discuss any questions you have with your health care provider.  Document Revised: 09/13/2023 Document Reviewed: 02/21/2023 Elsevier Patient Education  2024 [employer].

## 2025-01-26 NOTE — Interval H&P Note (Signed)
 History and Physical Interval Note:  01/26/2025 8:50 AM  John Church  has presented today for surgery, with the diagnosis of BACTEREMIA.  The various methods of treatment have been discussed with the patient and family. After consideration of risks, benefits and other options for treatment, the patient has consented to  Procedures: TRANSESOPHAGEAL ECHOCARDIOGRAM (N/A) as a surgical intervention.  The patient's history has been reviewed, patient examined, no change in status, stable for surgery.  I have reviewed the patient's chart and labs.  Questions were answered to the patient's satisfaction.     Donyell Ding H Azobou Tonleu

## 2025-01-26 NOTE — CV Procedure (Signed)
" ° ° °  TRANSESOPHAGEAL ECHOCARDIOGRAM   NAME:  John Church    MRN: [id number] DOB:  1976-05-02    ADMIT DATE: 01/26/2025  INDICATIONS: Bacteremia  PROCEDURE:  Informed consent was obtained prior to the procedure. The risks, benefits and alternatives for the procedure were discussed and the patient comprehended these risks.  Risks include, but are not limited to, cough, sore throat, vomiting, nausea, somnolence, esophageal and stomach trauma or perforation, bleeding, low blood pressure, aspiration, pneumonia, infection, trauma to the teeth and death.    Procedural time out performed. The oropharynx was anesthetized with topical 1% benzocaine.    Anesthesia was administered by Dr. Stoltzfus.   The transesophageal probe was inserted in the esophagus and stomach without difficulty and multiple views were obtained.   COMPLICATIONS:   There were no immediate complications.  KEY FINDINGS: No evidence of endocarditis.  Full report to follow. Further management per primary team.   Signed, Joelle DEL. Ren Ny, MD, [facility]  [facility]  10:16 AM  "

## 2025-01-26 NOTE — Anesthesia Postprocedure Evaluation (Signed)
"   Anesthesia Post Note  Patient: John Church  Procedure(s) Performed: TRANSESOPHAGEAL ECHOCARDIOGRAM     Patient location during evaluation: PACU Anesthesia Type: MAC Level of consciousness: awake and alert Pain management: pain level controlled Vital Signs Assessment: post-procedure vital signs reviewed and stable Respiratory status: spontaneous breathing, nonlabored ventilation, respiratory function stable and patient connected to nasal cannula oxygen Cardiovascular status: stable and blood pressure returned to baseline Postop Assessment: no apparent nausea or vomiting Anesthetic complications: no   No notable events documented.  Last Vitals:  Vitals:   01/26/25 1040 01/26/25 1045  BP: 128/87 (!) 131/90  Pulse: 77 75  Resp: 11 13  Temp:    SpO2: 97% 98%    Last Pain:  Vitals:   01/26/25 1014  TempSrc: Temporal  PainSc: Asleep                 Cordella SQUIBB Sangeeta Youse      "

## 2025-01-26 NOTE — Transfer of Care (Signed)
 Immediate Anesthesia Transfer of Care Note  Patient: John Church  Procedure(s) Performed: TRANSESOPHAGEAL ECHOCARDIOGRAM  Patient Location: PACU and Cath Lab  Anesthesia Type:MAC  Level of Consciousness: drowsy, patient cooperative, and responds to stimulation  Airway & Oxygen Therapy: Patient Spontanous Breathing and Patient connected to nasal cannula oxygen  Post-op Assessment: Report given to RN and Post -op Vital signs reviewed and stable  Post vital signs: Reviewed and stable  Last Vitals:  Vitals Value Taken Time  BP    Temp    Pulse    Resp    SpO2      Last Pain:  Vitals:   01/26/25 0952  TempSrc:   PainSc: 0-No pain         Complications: No notable events documented.

## 2025-01-27 ENCOUNTER — Ambulatory Visit: Admitting: Internal Medicine

## 2025-01-27 ENCOUNTER — Encounter: Payer: Self-pay | Admitting: Internal Medicine

## 2025-01-27 ENCOUNTER — Other Ambulatory Visit: Payer: Self-pay

## 2025-01-27 ENCOUNTER — Encounter (HOSPITAL_COMMUNITY): Payer: Self-pay

## 2025-01-27 ENCOUNTER — Ambulatory Visit: Admitting: *Deleted

## 2025-01-27 VITALS — BP 137/90 | HR 92 | Temp 98.2°F | Resp 16 | Wt 218.0 lb

## 2025-01-27 VITALS — BP 161/84 | HR 90 | Temp 98.4°F | Resp 18 | Ht 74.0 in | Wt 218.0 lb

## 2025-01-27 DIAGNOSIS — Z452 Encounter for adjustment and management of vascular access device: Secondary | ICD-10-CM | POA: Insufficient documentation

## 2025-01-27 DIAGNOSIS — M4650 Other infective spondylopathies, site unspecified: Secondary | ICD-10-CM

## 2025-01-27 LAB — SEDIMENTATION RATE: Sed Rate: 41 mm/h — ABNORMAL HIGH (ref 0–15)

## 2025-01-27 NOTE — Patient Instructions (Signed)
 Labs today Pul PICC ASAP F/U with ID on  Please send K[facility] report on 3/12

## 2025-01-27 NOTE — Patient Instructions (Signed)
 Removing a PICC Line (PICC Removal) in Adults: What to Know After After having a soft tube called a peripherally inserted central catheter (PICC) taken out, it's common to have some tenderness or soreness at the site where the PICC was removed. You may also have: Redness. Swelling. A scab. Follow these instructions at home: For the first 24 hours after the PICC is removed: Keep the bandage over the site clean and dry. Do not remove the bandage until told. Do not lift or do things that take a lot of effort until your health care provider says it's OK. Avoid: Lifting weights. Doing yard work. Doing things with repeated arm movements. Watch for any signs of an air bubble in your vein (air embolism). This is rare but dangerous. Signs may include: Trouble breathing. Making a high-pitched whistling sound when you breathe out (wheezing). Chest pain. A fast heart rate. If you have signs of an air embolism: Call 911 right away. Lie down on your left side. After 24 hours have passed:  Remove your bandage as told. Wash your hands with soap and water for at least 20 seconds before and after you remove the bandage. If you can't use soap and water, use hand sanitizer. A small scab may form over the site. Do not pick at the scab. Do not take baths, swim, or use a hot tub until you're told it's OK. Ask if you can shower. The site can be gently washed with soap and water and patted dry. Check the area around your site every day for signs of infection. Check for: Redness, swelling, or pain. Fluid or blood. Warmth. Pus or a bad smell. A red streak spreading away from your site. Ask what things are safe for you to do at home. Ask when you can go back to work or school. General instructions Take your medicines only as told. Contact a health care provider if: You have a fever or chills. You have swelling at the site or swollen glands under your arm. You have soreness, redness, or swelling in your  arm that gets worse. You have any signs of infection. Get help right away if: You have numbness or tingling in your fingers, hand, or arm. Your arm looks blue and feels cold. You have signs of an air embolism. These symptoms may be an emergency. Call 911 right away. Do not wait to see if the symptoms will go away. Do not drive yourself to the hospital. This information is not intended to replace advice given to you by your health care provider. Make sure you discuss any questions you have with your health care provider. Document Revised: 09/17/2024 Document Reviewed: 09/17/2024 [employer] Patient Education  2025 [employer].

## 2025-01-28 LAB — CULTURE, BLOOD (SINGLE)
MICRO NUMBER:: 17543053
MICRO NUMBER:: 17543054
SPECIMEN QUALITY:: ADEQUATE
SPECIMEN QUALITY:: ADEQUATE

## 2025-01-28 LAB — C-REACTIVE PROTEIN: CRP: 28.6 mg/L — ABNORMAL HIGH

## 2025-02-11 ENCOUNTER — Ambulatory Visit (HOSPITAL_COMMUNITY)

## 2025-02-12 ENCOUNTER — Ambulatory Visit: Admitting: Orthopedic Surgery

## 2025-02-26 ENCOUNTER — Ambulatory Visit

## 2025-03-05 ENCOUNTER — Ambulatory Visit: Payer: Self-pay | Admitting: Internal Medicine
